# Patient Record
Sex: Female | Born: 2000
Health system: Southern US, Community
[De-identification: ages and names within clinical notes are randomized; demographics above are authoritative.]

## PROBLEM LIST (undated history)

## (undated) DIAGNOSIS — R51 Headache: Secondary | ICD-10-CM

## (undated) DIAGNOSIS — T7840XA Allergy, unspecified, initial encounter: Secondary | ICD-10-CM

## (undated) DIAGNOSIS — R519 Headache, unspecified: Secondary | ICD-10-CM

## (undated) DIAGNOSIS — J189 Pneumonia, unspecified organism: Secondary | ICD-10-CM

## (undated) DIAGNOSIS — F419 Anxiety disorder, unspecified: Secondary | ICD-10-CM

## (undated) DIAGNOSIS — F32A Depression, unspecified: Secondary | ICD-10-CM

## (undated) DIAGNOSIS — J45909 Unspecified asthma, uncomplicated: Secondary | ICD-10-CM

## (undated) DIAGNOSIS — H539 Unspecified visual disturbance: Secondary | ICD-10-CM

## (undated) HISTORY — DX: Unspecified asthma, uncomplicated: J45.909

## (undated) HISTORY — DX: Pneumonia, unspecified organism: J18.9

## (undated) HISTORY — DX: Headache: R51

## (undated) HISTORY — DX: Anxiety disorder, unspecified: F41.9

## (undated) HISTORY — DX: Allergy, unspecified, initial encounter: T78.40XA

## (undated) HISTORY — DX: Headache, unspecified: R51.9

---
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
## 2019-01-02 ENCOUNTER — Other Ambulatory Visit: Payer: Self-pay

## 2019-01-03 ENCOUNTER — Ambulatory Visit: Payer: BC Managed Care – PPO | Admitting: Family Medicine

## 2019-01-07 ENCOUNTER — Telehealth: Payer: Self-pay | Admitting: Obstetrics and Gynecology

## 2019-01-07 DIAGNOSIS — O020 Blighted ovum and nonhydatidiform mole: Secondary | ICD-10-CM

## 2019-01-07 NOTE — Telephone Encounter (Signed)
Phone call to [facility].  She has not been seen in 2 weeks.  Quantitative hCG levels are dropping nicely.  Patient advised to recheck hCG this week and then weekly until 0, with plans for 3 months of follow-up hCGs.  IUD in place.  Patient additionally has questions about some headaches and nausea that she has been experiencing and wondering if it is related to the IUD.  She also was informed that it would not be unusual for her to be amenorrheic as the IUD was placed promptly after the suction D&C

## 2019-01-13 ENCOUNTER — Other Ambulatory Visit: Payer: Self-pay

## 2019-01-14 ENCOUNTER — Encounter: Payer: Self-pay | Admitting: Family Medicine

## 2019-01-14 ENCOUNTER — Ambulatory Visit (INDEPENDENT_AMBULATORY_CARE_PROVIDER_SITE_OTHER): Payer: BC Managed Care – PPO | Admitting: Family Medicine

## 2019-01-14 VITALS — BP 120/80 | HR 84 | Temp 98.8°F | Resp 12 | Ht 64.0 in | Wt 158.0 lb

## 2019-01-14 DIAGNOSIS — F332 Major depressive disorder, recurrent severe without psychotic features: Secondary | ICD-10-CM | POA: Diagnosis not present

## 2019-01-14 DIAGNOSIS — F411 Generalized anxiety disorder: Secondary | ICD-10-CM | POA: Diagnosis not present

## 2019-01-14 DIAGNOSIS — O020 Blighted ovum and nonhydatidiform mole: Secondary | ICD-10-CM

## 2019-01-14 MED ORDER — FLUOXETINE HCL 20 MG PO CAPS: 20.0000 mg | ORAL_CAPSULE | Freq: Every day | ORAL | 3 refills | Status: DC

## 2019-01-14 NOTE — Assessment & Plan Note (Signed)
This is been a very traumatic event for her going through the molar pregnancy having to have the procedure done and then IUD placed.  I think she had very helpful feelings with regard to this pregnancy and her new relationship and this did not end out how she had planned.  She is willing to seek psychotherapy and get back on her medications.  I Minna start her on Prozac 20 mg a day.  I have given her the information and phone number for [facility] she is going to call them and set up her appointment to get back in with her psychiatrist and psychotherapist.  I will go ahead and schedule follow-up with me in a few weeks to make sure she is staying on track.  While we will go ahead and check her hCG level since we have a lab here.  I will forward this to her OB/GYN

## 2019-01-14 NOTE — Patient Instructions (Addendum)
Start prozac once a day  Call [facility]-  Address: [address] [place], [place] [address] Phone: [phone] F/U 4 weeks

## 2019-01-14 NOTE — Progress Notes (Signed)
Subjective:    Patient ID: Amy Fuller, female    DOB: [birth year], 18 y.o.   MRN: [id number]  Patient presents for Depression (stopped meds d/t pregnancy- molar pregnancy- needs new meds) Patient here to discuss medications.  At her last visit she had a positive pregnancy test.  She had bleeding during pregnancy was found to have a molar pregnancy.  She had extremely high elevated hCG levels greater than 300,000.  She underwent D&C by OB/GYN.  She is due to have repeat hCG quant levels.  She did have IUD placed 2 weeks ago with initially having some headaches nausea but that has now subsided.  This is been a very stressful event for her.  She has a lot of difficulty going through the GYN process which she explains.  She is also felt more anxious having more panic attacks.  She has not had any self-harm or cutting.  Initially hurt her boyfriend were at odds but they are now working together and he has been very supportive.  She is back in school to get her high school diploma and would like to return to psychotherapy.  Until the end of last year she was on Abilify, trazodone, Prozac and Risperdal.  She states that she has had a few episodes where she felt like she has had an out of body experience.  She sees herself walking with her boyfriend their 2 kids and what looks like a little girl but she cannot see the face.  This is happened a few times since she lost the pregnancy.  She also finds herself quite tearful if she sees another pregnant lady out in public or small baby Review Of Systems:  GEN- denies fatigue, fever, weight loss,weakness, recent illness HEENT- denies eye drainage, change in vision, nasal discharge, CVS- denies chest pain, palpitations RESP- denies SOB, cough, wheeze ABD- denies N/V, change in stools, abd pain GU- denies dysuria, hematuria, dribbling, incontinence MSK- denies joint pain, muscle aches, injury Neuro- denies headache, dizziness, syncope, seizure  activity       Objective:    BP 120/80   Pulse 84   Temp 98.8 F (37.1 C) (Oral)   Resp 12   Ht 5\' 4"  (1.626 m)   Wt 158 lb (71.7 kg)   SpO2 98%   BMI 27.12 kg/m  GEN- NAD, alert and oriented x3 HEENT- PERRL, EOMI, non injected sclera, pink conjunctiva, MMM, oropharynx clear Neck- Supple, no thyromegaly CVS- RRR, no murmur RESP-CTAB ABD-NABS,soft,NT,ND pSYCH-depressed affect, anxious appearing normal speech good eye contact no suicidal ideations       Assessment & Plan:      Problem List Items Addressed This Visit      Unprioritized   GAD (generalized anxiety disorder)   Relevant Medications   FLUoxetine (PROZAC) 20 MG capsule   MDD (major depressive disorder), recurrent episode, severe (HCC)    This is been a very traumatic event for her going through the molar pregnancy having to have the procedure done and then IUD placed.  I think she had very helpful feelings with regard to this pregnancy and her new relationship and this did not end out how she had planned.  She is willing to seek psychotherapy and get back on her medications.  I Minna start her on Prozac 20 mg a day.  I have given her the information and [facility] for [facility] she is going to call them and set up her appointment to get back in with her  psychiatrist and psychotherapist.  I will go ahead and schedule follow-up with me in a few weeks to make sure she is staying on track.  While we will go ahead and check her hCG level since we have a lab here.  I will forward this to her OB/GYN      Relevant Medications   FLUoxetine (PROZAC) 20 MG capsule    Other Visit Diagnoses    Molar pregnancy    -  Primary   Relevant Orders   hCG, quantitative, pregnancy      Note: This dictation was prepared with Dragon dictation along with smaller phrase technology. Any transcriptional errors that result from this process are unintentional.

## 2019-01-15 ENCOUNTER — Telehealth: Payer: Self-pay | Admitting: Obstetrics and Gynecology

## 2019-01-15 LAB — HCG, QUANTITATIVE, PREGNANCY: HCG, Total, QN: 673 m[IU]/mL

## 2019-01-15 NOTE — Telephone Encounter (Signed)
Pt went to her PCP yesterday and they drew her HCG levels and is requesting a nurse to call her with the results.

## 2019-01-15 NOTE — Telephone Encounter (Addendum)
Patient had hcg level drawn at her PCP office yesterday. Results are in epic. Patient requesting to know what plan is for this.   Ref Range & Units 1d ago  HCG, Total, QN mIU/mL 673

## 2019-01-17 ENCOUNTER — Other Ambulatory Visit: Payer: Self-pay | Admitting: Obstetrics and Gynecology

## 2019-01-17 DIAGNOSIS — O020 Blighted ovum and nonhydatidiform mole: Secondary | ICD-10-CM

## 2019-01-17 NOTE — Progress Notes (Signed)
hcg has levelled off at 673, noted 20 days after 691.  will discuss with Gyn Onc over how long to follow before referral.

## 2019-01-19 ENCOUNTER — Telehealth: Payer: Self-pay | Admitting: Obstetrics and Gynecology

## 2019-01-19 DIAGNOSIS — O020 Blighted ovum and nonhydatidiform mole: Secondary | ICD-10-CM

## 2019-01-19 NOTE — Telephone Encounter (Signed)
Amy Fuller is aware that HCG levels have leveled off, and did not decline as much as expected from 7/8 til 7/28, 691->673. I will discuss with Dr Denman George, and am ordering a Q hcg for this week.  Pt is aware that future therapy with methotrexate is one treatment option that may be needed. Will follow up after this Wednesday's Q-Hcg and discussion with Dr Denman George.

## 2019-01-20 ENCOUNTER — Other Ambulatory Visit: Payer: Self-pay

## 2019-01-20 ENCOUNTER — Ambulatory Visit (INDEPENDENT_AMBULATORY_CARE_PROVIDER_SITE_OTHER): Payer: BC Managed Care – PPO | Admitting: Family Medicine

## 2019-01-20 ENCOUNTER — Encounter: Payer: Self-pay | Admitting: Family Medicine

## 2019-01-20 VITALS — BP 128/60 | HR 88 | Temp 98.6°F | Resp 14 | Ht 64.0 in | Wt 158.0 lb

## 2019-01-20 DIAGNOSIS — F411 Generalized anxiety disorder: Secondary | ICD-10-CM

## 2019-01-20 DIAGNOSIS — F332 Major depressive disorder, recurrent severe without psychotic features: Secondary | ICD-10-CM | POA: Diagnosis not present

## 2019-01-20 DIAGNOSIS — L509 Urticaria, unspecified: Secondary | ICD-10-CM | POA: Diagnosis not present

## 2019-01-20 MED ORDER — CITALOPRAM HYDROBROMIDE 10 MG PO TABS: 10.0000 mg | ORAL_TABLET | Freq: Every day | ORAL | 1 refills | Status: DC

## 2019-01-20 NOTE — Assessment & Plan Note (Signed)
Will D/C prozac and newest medication prior to hives No new foods, lotions etc Will not chance trying again,especially with body already in semi- inflammed state trying to rid of molar pregnancy She has been on celexa as well in past and does not recall any problems with that Will start celexa 10mg  once a day

## 2019-01-20 NOTE — Patient Instructions (Addendum)
Call [facility]-  Address: [address] [place], [place] [address] Phone: [phone] Start celexa 10mg  once a day  Use topical cortisone on arm  F/U 4 weeks for medications

## 2019-01-20 NOTE — Progress Notes (Signed)
   Subjective:    Patient ID: Amy Fuller, female    DOB: [birth year], 18 y.o.   MRN: [id number]  Patient presents for Rash (just started Prozac last night- woke up with hives on arm this morning- has gotten better)  Pt here with rash to left arm that started this morning around 7:30am. She took her first dose of prozac last night. She has taken prozac in the past. She took medication around 11pm, but couldn't go to sleep until 4am. Only broke out on left arm, no where else, no facial swelling. Very pruritic   It went down on its own after a few hours, did not take bendaryl as this tends to make her jittery and she had school today.   No fever, no chills  Due for a repeat lab test this Wed for her molar pregnancy, levels have dropped but not as low as GYN would expect   Note she has not called [facility] yet- which again I discussed the importance of  Review Of Systems:  GEN- denies fatigue, fever, weight loss,weakness, recent illness HEENT- denies eye drainage, change in vision, nasal discharge, CVS- denies chest pain, palpitations RESP- denies SOB, cough, wheeze ABD- denies N/V, change in stools, abd pain GU- denies dysuria, hematuria, dribbling, incontinence MSK- denies joint pain, muscle aches, injury Neuro- denies headache, dizziness, syncope, seizure activity       Objective:    BP 128/60   Pulse 88   Temp 98.6 F (37 C) (Oral)   Resp 14   Ht 5\' 4"  (1.626 m)   Wt 158 lb (71.7 kg)   SpO2 99%   BMI 27.12 kg/m  GEN- NAD, alert and oriented x3 HEENT- PERRL, EOMI, non injected sclera, pink conjunctiva, MMM, oropharynx clear, uvula midline  Neck- Supple, LAD CVS- RRR, no murmur RESP-CTAB Skin- few scattered mild fine erythematous maculoapular left on inner left wrist,rest of arm clear        Assessment & Plan:      Problem List Items Addressed This Visit      Unprioritized   GAD (generalized anxiety disorder) - Primary   Relevant Medications   citalopram  (CELEXA) 10 MG tablet   MDD (major depressive disorder), recurrent episode, severe (HCC)    Will D/C prozac and newest medication prior to hives No new foods, lotions etc Will not chance trying again,especially with body already in semi- inflammed state trying to rid of molar pregnancy She has been on celexa as well in past and does not recall any problems with that Will start celexa 10mg  once a day       Relevant Medications   citalopram (CELEXA) 10 MG tablet    Other Visit Diagnoses    Hives       Minimal residual hives on left arm, can use topical cortisone for itching      Note: This dictation was prepared with Dragon dictation along with smaller phrase technology. Any transcriptional errors that result from this process are unintentional.

## 2019-01-21 ENCOUNTER — Telehealth: Payer: Self-pay

## 2019-01-21 ENCOUNTER — Telehealth: Payer: Self-pay | Admitting: Obstetrics and Gynecology

## 2019-01-21 NOTE — Telephone Encounter (Signed)
Gave message to receptionist stating that Dr. Denman George recommends 3 HCG levels from 3 weeks in a row to be sent to her to review to determine if a  referral to GYN Oncology for molar pregnancy needed.

## 2019-01-21 NOTE — Telephone Encounter (Signed)
Louise with Dr. Serita Grit office states Dr. Denman George advises to have pt to have her  HCG levels drawn for 3 weeks in a row and then send to Dr. Denman George for review. [phone]

## 2019-01-22 ENCOUNTER — Other Ambulatory Visit: Payer: BC Managed Care – PPO

## 2019-01-22 ENCOUNTER — Other Ambulatory Visit: Payer: Self-pay

## 2019-01-22 DIAGNOSIS — O020 Blighted ovum and nonhydatidiform mole: Secondary | ICD-10-CM

## 2019-01-23 ENCOUNTER — Telehealth: Payer: Self-pay | Admitting: *Deleted

## 2019-01-23 ENCOUNTER — Telehealth: Payer: Self-pay | Admitting: Obstetrics and Gynecology

## 2019-01-23 LAB — BETA HCG QUANT (REF LAB): hCG Quant: 459 m[IU]/mL

## 2019-01-23 NOTE — Telephone Encounter (Signed)
Patient informed of HCG level.  Verbalized understanding.

## 2019-01-23 NOTE — Telephone Encounter (Signed)
Patient called stating that she would like a call back from the nurse, pt states that she had blood work and would like to know the results.

## 2019-01-27 ENCOUNTER — Other Ambulatory Visit: Payer: Self-pay | Admitting: Obstetrics and Gynecology

## 2019-01-27 DIAGNOSIS — O020 Blighted ovum and nonhydatidiform mole: Secondary | ICD-10-CM

## 2019-01-27 NOTE — Progress Notes (Signed)
Followup HCG at 56 days after d&c ordered.

## 2019-01-29 ENCOUNTER — Encounter: Payer: Self-pay | Admitting: Obstetrics and Gynecology

## 2019-01-29 ENCOUNTER — Other Ambulatory Visit: Payer: Self-pay

## 2019-01-29 ENCOUNTER — Ambulatory Visit (INDEPENDENT_AMBULATORY_CARE_PROVIDER_SITE_OTHER): Payer: BC Managed Care – PPO | Admitting: Obstetrics and Gynecology

## 2019-01-29 VITALS — BP 133/82 | HR 95 | Ht 64.0 in | Wt 154.2 lb

## 2019-01-29 DIAGNOSIS — O020 Blighted ovum and nonhydatidiform mole: Secondary | ICD-10-CM

## 2019-01-29 NOTE — Progress Notes (Addendum)
Patient ID: Amy Fuller, female   DOB: 09/04/2000, 18 y.o.   MRN: [id number]  Subjective:  Amy Fuller is a 18 y.o. female now 7 weeks status post suction dilation and curettage for COMPLETE MOLAR PREGNANCY,    some cramping with old brownish discharge,  Review of Systems Negative except some cramping   Diet:   normal   Bowel movements : normal.  Pain is controlled with current analgesics. Medications being used: ibuprofen (OTC).  Objective:  There were no vitals taken for this visit. General:Well developed, well nourished.  No acute distress. Abdomen: Bowel sounds normal, soft, non-tender. Pelvic Exam: Deferred, discussion only  Incision(s): N/a  Assessment:  Post-Op 7 weeks s/p suction dilation and curettage   for COMPLETE MOLAR PREGNANCY, WITH persistent low hcg levels.    Plan:  1.HCG level discussed  300k+- 600's -- now 491.(week 6) 2. current medications.ibuprofen 3. Activity restrictions: none 4. return to work: not applicable. 5. Follow up in 1 week w/ HCG blood work if remains elevated at 8wk will refer for  methotrexate 7. Televisit 2 weeks to decide on referral.  By signing my name below, I, Samul Dada, attest that this documentation has been prepared under the direction and in the presence of Jonnie Kind, MD. Electronically Signed: [employer]. 01/29/19. 10:05 AM.  I personally performed the services described in this documentation, which was SCRIBED in my presence. The recorded information has been reviewed and considered accurate. It has been edited as necessary during review. Jonnie Kind, MD

## 2019-01-31 ENCOUNTER — Other Ambulatory Visit: Payer: BC Managed Care – PPO

## 2019-02-03 ENCOUNTER — Other Ambulatory Visit: Payer: Self-pay | Admitting: Obstetrics and Gynecology

## 2019-02-03 DIAGNOSIS — O020 Blighted ovum and nonhydatidiform mole: Secondary | ICD-10-CM

## 2019-02-03 NOTE — Progress Notes (Signed)
HCG lab order for early this week placed, to move up from Friday.

## 2019-02-04 ENCOUNTER — Other Ambulatory Visit: Payer: Self-pay

## 2019-02-04 ENCOUNTER — Encounter (HOSPITAL_COMMUNITY): Payer: Self-pay | Admitting: Emergency Medicine

## 2019-02-04 ENCOUNTER — Telehealth: Payer: Self-pay | Admitting: Obstetrics and Gynecology

## 2019-02-04 ENCOUNTER — Emergency Department (HOSPITAL_COMMUNITY)
Admission: EM | Admit: 2019-02-04 | Discharge: 2019-02-04 | Disposition: A | Discharge status: Home / Self Care | Payer: BC Managed Care – PPO | Attending: Emergency Medicine | Admitting: Emergency Medicine

## 2019-02-04 DIAGNOSIS — Z7722 Contact with and (suspected) exposure to environmental tobacco smoke (acute) (chronic): Secondary | ICD-10-CM | POA: Diagnosis not present

## 2019-02-04 DIAGNOSIS — R102 Pelvic and perineal pain: Secondary | ICD-10-CM | POA: Diagnosis not present

## 2019-02-04 LAB — URINALYSIS, ROUTINE W REFLEX MICROSCOPIC
Bilirubin Urine: NEGATIVE
Glucose, UA: NEGATIVE mg/dL
Ketones, ur: NEGATIVE mg/dL
Nitrite: NEGATIVE
Protein, ur: 100 mg/dL — AB
Specific Gravity, Urine: 1.029 (ref 1.005–1.030)
pH: 5 (ref 5.0–8.0)

## 2019-02-04 LAB — COMPREHENSIVE METABOLIC PANEL
ALT: 13 U/L (ref 0–44)
AST: 16 U/L (ref 15–41)
Albumin: 3.9 g/dL (ref 3.5–5.0)
Alkaline Phosphatase: 94 U/L (ref 38–126)
Anion gap: 5 (ref 5–15)
BUN: 10 mg/dL (ref 6–20)
CO2: 25 mmol/L (ref 22–32)
Calcium: 8.9 mg/dL (ref 8.9–10.3)
Chloride: 108 mmol/L (ref 98–111)
Creatinine, Ser: 0.61 mg/dL (ref 0.44–1.00)
GFR calc Af Amer: >60 mL/min (ref >60)
GFR calc non Af Amer: >60 mL/min (ref >60)
Glucose, Bld: 91 mg/dL (ref 70–99)
Potassium: 3.6 mmol/L (ref 3.5–5.1)
Sodium: 138 mmol/L (ref 135–145)
Total Bilirubin: 0.3 mg/dL (ref 0.3–1.2)
Total Protein: 6.8 g/dL (ref 6.5–8.1)

## 2019-02-04 LAB — I-STAT BETA HCG BLOOD, ED (MC, WL, AP ONLY): I-stat hCG, quantitative: 141.2 m[IU]/mL — ABNORMAL HIGH (ref <5)

## 2019-02-04 LAB — CBC WITH DIFFERENTIAL/PLATELET
Abs Immature Granulocytes: 0.03 10*3/uL (ref 0.00–0.07)
Basophils Absolute: 0.1 10*3/uL (ref 0.0–0.1)
Basophils Relative: 1 %
Eosinophils Absolute: 0.3 10*3/uL (ref 0.0–0.5)
Eosinophils Relative: 3 %
HCT: 43.3 % (ref 36.0–46.0)
Hemoglobin: 13.9 g/dL (ref 12.0–15.0)
Immature Granulocytes: 0 %
Lymphocytes Relative: 26 %
Lymphs Abs: 2.6 10*3/uL (ref 0.7–4.0)
MCH: 30 pg (ref 26.0–34.0)
MCHC: 32.1 g/dL (ref 30.0–36.0)
MCV: 93.3 fL (ref 80.0–100.0)
Monocytes Absolute: 0.9 10*3/uL (ref 0.1–1.0)
Monocytes Relative: 9 %
Neutro Abs: 6.2 10*3/uL (ref 1.7–7.7)
Neutrophils Relative %: 61 %
Platelets: 309 10*3/uL (ref 150–400)
RBC: 4.64 MIL/uL (ref 3.87–5.11)
RDW: 11.9 % (ref 11.5–15.5)
WBC: 10.1 10*3/uL (ref 4.0–10.5)
nRBC: 0 % (ref 0.0–0.2)

## 2019-02-04 LAB — WET PREP, GENITAL
Clue Cells Wet Prep HPF POC: NONE SEEN
Sperm: NONE SEEN
Trich, Wet Prep: NONE SEEN
Yeast Wet Prep HPF POC: NONE SEEN

## 2019-02-04 NOTE — Telephone Encounter (Signed)
Patient called, stated that she and Dr. Glo Herring have been messaging through mychart.  She is in so much pain and would like to know what to do.  [phone]

## 2019-02-04 NOTE — Discharge Instructions (Addendum)
Return for ultrasound.  See Dr. Glo Herring for recheck.   Hcg is 141

## 2019-02-04 NOTE — Telephone Encounter (Signed)
Have her come in tomorrow as work in appt

## 2019-02-04 NOTE — ED Triage Notes (Signed)
Pt states she had cramping since her D&C in June for a molar pregnancy. States she has contacted [facility] about it since it has happened. When she called the on call nurse there they told her to come here. States she doesn't have any vaginal bleeding

## 2019-02-05 ENCOUNTER — Encounter: Payer: Self-pay | Admitting: Obstetrics and Gynecology

## 2019-02-05 ENCOUNTER — Ambulatory Visit (INDEPENDENT_AMBULATORY_CARE_PROVIDER_SITE_OTHER): Payer: BC Managed Care – PPO | Admitting: Obstetrics and Gynecology

## 2019-02-05 ENCOUNTER — Ambulatory Visit (HOSPITAL_COMMUNITY)
Admission: RE | Admit: 2019-02-05 | Discharge: 2019-02-05 | Disposition: A | Discharge status: Home / Self Care | Payer: BC Managed Care – PPO | Source: Ambulatory Visit | Attending: Obstetrics and Gynecology | Admitting: Obstetrics and Gynecology

## 2019-02-05 ENCOUNTER — Ambulatory Visit (HOSPITAL_COMMUNITY): Payer: BC Managed Care – PPO

## 2019-02-05 VITALS — BP 109/74 | HR 93 | Ht 63.0 in | Wt 156.3 lb

## 2019-02-05 DIAGNOSIS — O020 Blighted ovum and nonhydatidiform mole: Secondary | ICD-10-CM

## 2019-02-05 DIAGNOSIS — R102 Pelvic and perineal pain: Secondary | ICD-10-CM | POA: Diagnosis not present

## 2019-02-05 NOTE — ED Provider Notes (Signed)
[facility] EMERGENCY DEPARTMENT Provider Note   CSN: [id number] Arrival date & time: 02/04/19  1943     History   Chief Complaint Chief Complaint  Patient presents with  . Cramping (D&C hx)    HPI Amy Fuller is a 18 y.o. female.     Pt had a D and e on 6/26 due to a molar pregnancy.  Pt reports she continues to have lower abdominal cramping.  Pt denies any current bleeding.  No fever, no chills.  Pt denies std risk.  Pt has an IUD to prevent pregnancy.  Pt has been followed by Dr. Onnie Boer and HCg has been decreasing.   The history is provided by the patient. No language interpreter was used.    Past Medical History:  Diagnosis Date  . Allergy   . Asthma   . Frequent headaches   . Pneumonia 2004  . Vision abnormalities     Patient Active Problem List   Diagnosis Date Noted  . GAD (generalized anxiety disorder) 12/18/2017  . Deliberate self-cutting 12/18/2017  . Insomnia 12/15/2015  . MDD (major depressive disorder), recurrent episode, severe ([place]) 12/13/2015  . Hearing problem of both ears 09/03/2013  . Exercise-induced asthma 09/03/2013  . Hyperacusis of both ears 09/03/2013    Past Surgical History:  Procedure Laterality Date  . DILATION AND CURETTAGE OF UTERUS N/A 12/13/2018   Procedure: SUCTION DILATATION AND CURETTAGE;  Surgeon: Jonnie Kind, MD;  Location: [facility];  Service: Gynecology;  Laterality: N/A;     OB History    Gravida  1   Para      Term      Preterm      AB  1   Living        SAB      TAB      Ectopic      Multiple      Live Births               Home Medications    Prior to Admission medications   Medication Sig Start Date End Date Taking? Authorizing Provider  acetaminophen (TYLENOL) 500 MG tablet Take 500 mg by mouth every 6 (six) hours as needed for mild pain or moderate pain.   Yes [provider]  citalopram (CELEXA) 10 MG tablet Take 1 tablet (10 mg total) by mouth daily. Patient not  taking: Reported on 01/29/2019 01/20/19   Alycia Rossetti, MD    Family History Family History  Problem Relation Age of Onset  . Miscarriages / [place] Mother   . Arthritis Maternal Grandmother   . Heart disease Maternal Grandfather   . Hypertension Maternal Grandfather   . Cancer Paternal Grandmother   . Cancer Paternal Grandfather     Social History Social History   Tobacco Use  . Smoking status: Passive Smoke Exposure - Never Smoker  . Smokeless tobacco: Never Used  Substance Use Topics  . Alcohol use: No  . Drug use: Not Currently    Types: Marijuana     Allergies   Patient has no known allergies.   Review of Systems Review of Systems  Genitourinary: Negative for dysuria and vaginal discharge.  All other systems reviewed and are negative.    Physical Exam Updated Vital Signs BP 121/70 (BP Location: Right Arm)   Pulse 94   Temp 98.4 F (36.9 C) (Oral)   Resp 16   Ht 5\' 4"  (1.626 m)   Wt 74.8 kg  SpO2 99%   BMI 28.32 kg/m   Physical Exam Vitals signs and nursing note reviewed.  Constitutional:      Appearance: She is well-developed.  HENT:     Head: Normocephalic.     Right Ear: Tympanic membrane normal.     Left Ear: Tympanic membrane normal.     Nose: Nose normal.     Mouth/Throat:     Mouth: Mucous membranes are moist.  Neck:     Musculoskeletal: Normal range of motion.  Cardiovascular:     Rate and Rhythm: Normal rate and regular rhythm.     Pulses: Normal pulses.     Heart sounds: Normal heart sounds.  Pulmonary:     Effort: Pulmonary effort is normal.  Abdominal:     General: Abdomen is flat. There is no distension.     Palpations: Abdomen is soft.  Genitourinary:    General: Normal vulva.     Vagina: No vaginal discharge.     Comments: Tender uterus and cervix,  No discharge,  IUD strings visualized,  Musculoskeletal: Normal range of motion.  Skin:    General: Skin is warm.  Neurological:     General: No focal deficit  present.     Mental Status: She is alert and oriented to person, place, and time.  Psychiatric:        Mood and Affect: Mood normal.      ED Treatments / Results  Labs (all labs ordered are listed, but only abnormal results are displayed) Labs Reviewed  WET PREP, GENITAL - Abnormal; Notable for the following components:      Result Value   WBC, Wet Prep HPF POC MODERATE (*)    All other components within normal limits  URINALYSIS, ROUTINE W REFLEX MICROSCOPIC - Abnormal; Notable for the following components:   APPearance HAZY (*)    Hgb urine dipstick MODERATE (*)    Protein, ur 100 (*)    Leukocytes,Ua TRACE (*)    Bacteria, UA RARE (*)    All other components within normal limits  I-STAT BETA HCG BLOOD, ED ([facility], [facility], [facility] ONLY) - Abnormal; Notable for the following components:   I-stat hCG, quantitative 141.2 (*)    All other components within normal limits  CBC WITH DIFFERENTIAL/PLATELET  COMPREHENSIVE METABOLIC PANEL  GC/CHLAMYDIA PROBE AMP ([place]) NOT [facility]    EKG None  Radiology [place] Transvaginal Non-ob  Result Date: 02/05/2019 CLINICAL DATA:  Follow-up molar pregnancy.  Pelvic pain EXAM: ULTRASOUND PELVIS TRANSVAGINAL TECHNIQUE: Transvaginal ultrasound examination of the pelvis was performed including evaluation of the uterus, ovaries, adnexal regions, and pelvic cul-de-sac. COMPARISON:  12/09/2018 outside study FINDINGS: Uterus Measurements: 6.4 x 3.3 x 3.7 cm = volume: 40.8 mL. No fibroids or other mass visualized. Endometrium Thickness: 2 mm in thickness. IUD noted in place. Previously seen complex cystic areas and thickening of the endometrium no longer visualized. Right ovary Measurements: 2.4 x 2.5 x 1.3 cm = volume: 3.9 mL. Normal appearance/no adnexal mass. Left ovary Measurements: 2.3 x 2.8 x 1.7 cm = volume: 5.6 mL. Normal appearance/no adnexal mass. Other findings:  Trace free fluid in the pelvis. IMPRESSION: Endometrium has a normal appearance and thickness.  IUD noted in expected position. No acute findings. Electronically Signed   By: Charlett NoseKevin  Dover M.D.   On: 02/05/2019 15:20    Procedures Procedures (including critical care time)  Medications Ordered in ED Medications - No data to display   Initial Impression / Assessment and Plan /  ED Course  I have reviewed the triage vital signs and the nursing notes.  Pertinent labs & imaging results that were available during my care of the patient were reviewed by me and considered in my medical decision making (see chart for details).        MDM   HCg is declining.  Wet prep no acute findings.  GC and Ct ordered.  I will schedule pt for ultrasound.  Pt advised to call Dr. Emelda FearFerguson to schedule followup   Final Clinical Impressions(s) / ED Diagnoses   Final diagnoses:  Pelvic pain    ED Discharge Orders         Ordered    US PELVIC COMPLETE W TRANSVAGINAL AND TORSION R/O     02/04/19 2245        An After Visit Summary was printed and given to the patient.    Elson AreasSofia, Emmelina Mcloughlin K, [place]PA-C 02/05/19 1901    Mancel BaleWentz, [place], MD 02/07/19 1037

## 2019-02-05 NOTE — Telephone Encounter (Signed)
Called patient, lmoam to come at 10:30am today.  If this doesn't work for her to call us to reschedule

## 2019-02-05 NOTE — Progress Notes (Addendum)
Patient ID: Amy Fuller, female   DOB: [birth year], 18 y.o.   MRN: [id number]    [facility]  @DATE @            Patient name: Amy Fuller MRN [id number]  Date of birth: [birth year]  CC & HPI:  Amy Fuller is a 18 y.o. female presenting today for ED followup and discussion of HCG lab results. Quant count at 141.2 from 02/04/2019 lab. Had cramping and called twice yesterday and "never got a call back".  Pt called after hours message center, and On call nurse service told her to go to ER.  ED records not available for review . Pt reports that she "Was supposed to have u/s ordered in f/u of ED visit but that is not yet completed. ROS: Had severe cramping 02/03/2019 had to leave work due to vomiting. Is not in unbearable pain at current time of appointment. Ibuprofen and Tylenol and toradol didn't work. Doesn't want pain med to take consistently, only for severe pain. Has tried hot baths, compress and "nothing helps." She has taken ibuprofen, tylenol, toradol ( once) Patient remains somewhat angry over most aspects of the care for the molar pregnancy..  She mentions again that when she first called 1 afternoon for her concerns early in the pregnancy, that she did not receive a phone call back that afternoon. I have let her know that we have reviewed processes to be sure that calls are routed to RN/LPN/Provider ,  ROS:  ROS +abdominal cramping +anxiety, impatient  -vaginal bleeding -fever  Pertinent History Reviewed:   Reviewed:  Medical         Past Medical History:  Diagnosis Date  . Allergy   . Asthma   . Frequent headaches   . Pneumonia 2004  . Vision abnormalities                               Surgical Hx:    Past Surgical History:  Procedure Laterality Date  . DILATION AND CURETTAGE OF UTERUS N/A 12/13/2018   Procedure: SUCTION DILATATION AND CURETTAGE;  Surgeon: Tilda BurrowFerguson, Jayel Scaduto V, MD;  Location: [facility];  Service: Gynecology;  Laterality: N/A;   Medications:  Reviewed & Updated - see associated section                       Current Outpatient Medications:  .  acetaminophen (TYLENOL) 500 MG tablet, Take 500 mg by mouth every 6 (six) hours as needed for mild pain or moderate pain., Disp: , Rfl:  .  citalopram (CELEXA) 10 MG tablet, Take 1 tablet (10 mg total) by mouth daily. (Patient not taking: Reported on 01/29/2019), Disp: 30 tablet, Rfl: 1   Social History: Reviewed -  reports that she is a non-smoker but has been exposed to tobacco smoke. She has never used smokeless tobacco.  Objective Findings:  Vitals: Blood pressure 109/74, pulse 93, height 5\' 3"  (1.6 m), weight 156 lb 4.8 oz (70.9 kg), unknown if currently breastfeeding.  PHYSICAL EXAMINATION General appearance - alert, well appearing, and in no distress and anxious Mental status - alert, oriented to person, place, and time, normal behavior, speech, dress, motor activity, and thought processes, anxious  PELVIC Deferred, pt declines exam, was examined yesterday so sees no reason to repeat, which I agree with.  Assessment & Plan:   A:  1.  Molar pregnancy, slowly declining  HCG levels. 2. IUD in place 3. Pelvic discomforts tolerated poorly by pt.  P:  1. Tv u/s to check IUD placement 2.  will discuss with gyn oncologist will discuss course of care once u/s back to consider Possible use of methotrexate  Addendum:  Pt called office Thursday for results of u/s and to let us know she was not given Rx for analgesic.  Pt notified that u/s shows IUD in proper location and no significant tissue remains in endometrial cavity. Pt reports that Vicodin alternating with Ibuprofen solved her pain initially postop, and that she DOES need something in addition to ibuprofen. Vicodin 5 x 30 tabs e-scribed. Pt also informed that I have reviewed her case with Dr Denman George, who states that as long as HCG drops by 10% or more over a 14 day period,(3 weekly labs.), then waiting and watching is appropriate. If  HCG does NOT drop 10% then Dr Denman George follows recent data to suggest repeating D&C before using MTX, as 2/3 of cases will respond to repeat D&C and not need MTX. Jonnie Kind, MD 4:54 pm 8/20  By signing my name below, I, Samul Dada, attest that this documentation has been prepared under the direction and in the presence of Jonnie Kind, MD. Electronically Signed: [place]. 02/05/19. 11:08 AM.  I personally performed the services described in this documentation, which was SCRIBED in my presence. The recorded information has been reviewed and considered accurate. It has been edited as necessary during review. Jonnie Kind, MD

## 2019-02-06 ENCOUNTER — Telehealth: Payer: Self-pay | Admitting: *Deleted

## 2019-02-06 ENCOUNTER — Telehealth: Payer: Self-pay | Admitting: Obstetrics and Gynecology

## 2019-02-06 LAB — GC/CHLAMYDIA PROBE AMP (~~LOC~~) NOT AT ARMC
Chlamydia: NEGATIVE
Neisseria Gonorrhea: NEGATIVE

## 2019-02-06 MED ORDER — HYDROCODONE-ACETAMINOPHEN 5-325 MG PO TABS: 1.0000 | ORAL_TABLET | Freq: Four times a day (QID) | ORAL | 0 refills | Status: DC | PRN

## 2019-02-06 NOTE — Telephone Encounter (Signed)
Dr. Ferguson called pt and ordered Vicodin. JSY 

## 2019-02-06 NOTE — Telephone Encounter (Signed)
Dr. Glo Herring called pt and ordered Vicodin. [place]

## 2019-02-06 NOTE — Addendum Note (Signed)
Addended by: Jonnie Kind on: 02/06/2019 05:06 PM   Modules accepted: Orders

## 2019-02-06 NOTE — Telephone Encounter (Signed)
Call returned, and followup documented in addendum to office note from yesterday.

## 2019-02-06 NOTE — Telephone Encounter (Signed)
Patient called, stated she was seen yesterday and she stated Dr. Glo Herring asked her is she wanted pain meds and she said yes and he didn't give her anything.  She stated this is getting annoying.  She also stated Dr. Glo Herring told her he would call her w/her ultrasound results this morning.  [phone]  [facility]

## 2019-02-07 ENCOUNTER — Other Ambulatory Visit: Payer: Self-pay

## 2019-02-07 ENCOUNTER — Other Ambulatory Visit: Payer: BC Managed Care – PPO

## 2019-02-07 DIAGNOSIS — O020 Blighted ovum and nonhydatidiform mole: Secondary | ICD-10-CM | POA: Diagnosis not present

## 2019-02-08 LAB — BETA HCG QUANT (REF LAB): hCG Quant: 140 m[IU]/mL

## 2019-02-12 ENCOUNTER — Telehealth: Payer: Self-pay | Admitting: Obstetrics and Gynecology

## 2019-02-12 ENCOUNTER — Encounter: Payer: Self-pay | Admitting: Obstetrics and Gynecology

## 2019-02-12 ENCOUNTER — Telehealth (INDEPENDENT_AMBULATORY_CARE_PROVIDER_SITE_OTHER): Payer: BC Managed Care – PPO | Admitting: Obstetrics and Gynecology

## 2019-02-12 VITALS — Ht 65.0 in

## 2019-02-12 DIAGNOSIS — O020 Blighted ovum and nonhydatidiform mole: Secondary | ICD-10-CM

## 2019-02-12 NOTE — Telephone Encounter (Signed)
Amy Fuller was contacted by phone after the WebEx visit failed She had already completed the intake history with the nurse. Review of [name] records shows that the last hCG was 140, done last Friday, August 21 As per my discussions with Dr. Denman George, the plan is for continuing checking the serum hCGs on a regular basis and if there is less than a 10% drop in 2 weeks the patient will be referred to Dr. Denman George for consideration of repeat D&C. [name] agrees to checking the blood test every 2 weeks.  Discussed the option of weekly hCGs to or closely monitor trending and she prefers the every 2 week checks, after discussion.  We will follow-up with hCG check on September 4 and September 18 with telephone follow-up

## 2019-02-12 NOTE — Progress Notes (Signed)
webex visit unable to be completed due to technical problems.  Able to call pt and set up a plan for HCG levels q 2 wk. Will refer to gyn Onc if HCG levels fail to drop by 10% over any 2 wk period. As per discussion of case with Dr Denman George.

## 2019-02-26 ENCOUNTER — Telehealth: Payer: Self-pay | Admitting: *Deleted

## 2019-02-26 NOTE — Telephone Encounter (Signed)
Patient left message asking when she needs labs again.

## 2019-02-26 NOTE — Telephone Encounter (Signed)
Pt had a quant checked on 02/07/19. Pt needs to have it repeated. Pt wants to go on Friday, 02/28/19. Pt advised to go between 8-12. Pt voiced understanding. Pt placed on lab schedule. [place]

## 2019-02-28 ENCOUNTER — Other Ambulatory Visit: Payer: Self-pay

## 2019-02-28 ENCOUNTER — Other Ambulatory Visit: Payer: BC Managed Care – PPO

## 2019-02-28 DIAGNOSIS — O020 Blighted ovum and nonhydatidiform mole: Secondary | ICD-10-CM | POA: Diagnosis not present

## 2019-03-01 LAB — BETA HCG QUANT (REF LAB): hCG Quant: 116 m[IU]/mL

## 2019-03-02 NOTE — Telephone Encounter (Signed)
Unable to leave message, as pt has mailbox that is full. Pt will need q 2 wk hcg's to continue.

## 2019-03-11 ENCOUNTER — Other Ambulatory Visit: Payer: Self-pay | Admitting: *Deleted

## 2019-03-11 DIAGNOSIS — Z20822 Contact with and (suspected) exposure to covid-19: Secondary | ICD-10-CM

## 2019-03-11 DIAGNOSIS — R6889 Other general symptoms and signs: Secondary | ICD-10-CM | POA: Diagnosis not present

## 2019-03-12 LAB — NOVEL CORONAVIRUS, NAA: SARS-CoV-2, NAA: NOT DETECTED

## 2019-03-14 ENCOUNTER — Ambulatory Visit: Payer: BC Managed Care – PPO | Admitting: Obstetrics and Gynecology

## 2019-03-14 ENCOUNTER — Telehealth: Payer: Self-pay | Admitting: *Deleted

## 2019-03-14 DIAGNOSIS — O020 Blighted ovum and nonhydatidiform mole: Secondary | ICD-10-CM

## 2019-03-14 NOTE — Telephone Encounter (Signed)
Patient did not make an appointment for today or keep it.  Telephone call to patient.she needs a labs every 2 weeks her last lab was on the 11th I have advised her to come to the lab today.  Order has been put in

## 2019-03-14 NOTE — Telephone Encounter (Signed)
Pt wanting to know when she needs labs again.

## 2019-03-27 LAB — BETA HCG QUANT (REF LAB): hCG Quant: 19 m[IU]/mL

## 2019-04-14 ENCOUNTER — Telehealth: Payer: Self-pay | Admitting: Obstetrics and Gynecology

## 2019-04-14 NOTE — Telephone Encounter (Signed)
Telephone call to Amy Fuller as its been 3 weeks since her last hCG check.  She states that she will get her hCG rechecked on Wednesday of this week, 04/16/2019

## 2019-04-24 ENCOUNTER — Ambulatory Visit
Admission: EM | Admit: 2019-04-24 | Discharge: 2019-04-24 | Disposition: A | Discharge status: Home / Self Care | Payer: BC Managed Care – PPO | Attending: Emergency Medicine | Admitting: Emergency Medicine

## 2019-04-24 ENCOUNTER — Other Ambulatory Visit: Payer: Self-pay

## 2019-04-24 DIAGNOSIS — Z20828 Contact with and (suspected) exposure to other viral communicable diseases: Secondary | ICD-10-CM | POA: Diagnosis not present

## 2019-04-24 DIAGNOSIS — B9789 Other viral agents as the cause of diseases classified elsewhere: Secondary | ICD-10-CM

## 2019-04-24 DIAGNOSIS — J069 Acute upper respiratory infection, unspecified: Secondary | ICD-10-CM | POA: Diagnosis not present

## 2019-04-24 DIAGNOSIS — R05 Cough: Secondary | ICD-10-CM | POA: Diagnosis not present

## 2019-04-24 DIAGNOSIS — Z20822 Contact with and (suspected) exposure to covid-19: Secondary | ICD-10-CM

## 2019-04-24 MED ORDER — BENZONATATE 100 MG PO CAPS: 100.0000 mg | ORAL_CAPSULE | Freq: Three times a day (TID) | ORAL | 0 refills | Status: DC

## 2019-04-24 MED ORDER — CETIRIZINE-PSEUDOEPHEDRINE ER 5-120 MG PO TB12: 1.0000 | ORAL_TABLET | Freq: Every day | ORAL | 0 refills | Status: DC

## 2019-04-24 MED ORDER — FLUTICASONE PROPIONATE 50 MCG/ACT NA SUSP: 2.0000 | Freq: Every day | NASAL | 0 refills | Status: DC

## 2019-04-24 NOTE — ED Provider Notes (Signed)
[place]   [id number] 04/24/19 Arrival Time: 14   CC: URI symptoms; COVID testing  SUBJECTIVE: History from: patient.  Amy Fuller is a 18 y.o. female who presents with runny nose, and congestion x 1 day. Denies sick exposure to COVID, flu or strep.  Denies recent travel. Denies aggravating or alleviating factors. Reports previous symptoms in the past with cold.   Complains of cough with clearing throat.  Denies fever, chills, fatigue, sore throat, SOB, wheezing, chest pain, nausea, vomiting, changes in bowel or bladder habits.    ROS: As per HPI.  All other pertinent ROS negative.     Past Medical History:  Diagnosis Date  . Allergy   . Asthma   . Frequent headaches   . Pneumonia 2004  . Vision abnormalities    Past Surgical History:  Procedure Laterality Date  . DILATION AND CURETTAGE OF UTERUS N/A 12/13/2018   Procedure: SUCTION DILATATION AND CURETTAGE;  Surgeon: Jonnie Kind, MD;  Location: [facility];  Service: Gynecology;  Laterality: N/A;   No Known Allergies No current facility-administered medications on file prior to encounter.    Current Outpatient Medications on File Prior to Encounter  Medication Sig Dispense Refill  . acetaminophen (TYLENOL) 500 MG tablet Take 500 mg by mouth every 6 (six) hours as needed for mild pain or moderate pain.    . [DISCONTINUED] citalopram (CELEXA) 10 MG tablet Take 1 tablet (10 mg total) by mouth daily. (Patient not taking: Reported on 01/29/2019) 30 tablet 1   Social History   Socioeconomic History  . Marital status: Single    Spouse name: Not on file  . Number of children: 0  . Years of education: Not on file  . Highest education level: Not on file  Occupational History  . Not on file  Social Needs  . Financial resource strain: Not on file  . Food insecurity    Worry: Not on file    Inability: Not on file  . Transportation needs    Medical: Not on file    Non-medical: Not on file  Tobacco Use  .  Smoking status: Passive Smoke Exposure - Never Smoker  . Smokeless tobacco: Never Used  Substance and Sexual Activity  . Alcohol use: No  . Drug use: Not Currently    Types: Marijuana  . Sexual activity: Yes    Birth control/protection: I.U.D.  Lifestyle  . Physical activity    Days per week: Not on file    Minutes per session: Not on file  . Stress: Not on file  Relationships  . Social Herbalist on phone: Not on file    Gets together: Not on file    Attends religious service: Not on file    Active member of club or organization: Not on file    Attends meetings of clubs or organizations: Not on file    Relationship status: Not on file  . Intimate partner violence    Fear of current or ex partner: Not on file    Emotionally abused: Not on file    Physically abused: Not on file    Forced sexual activity: Not on file  Other Topics Concern  . Not on file  Social History Narrative  . Not on file   Family History  Problem Relation Age of Onset  . Miscarriages / [place] Mother   . Arthritis Maternal Grandmother   . Heart disease Maternal Grandfather   . Hypertension Maternal Grandfather   .  Cancer Paternal Grandmother   . Cancer Paternal Grandfather     OBJECTIVE:  Vitals:   04/24/19 1740  BP: 123/82  Pulse: (!) 110  Resp: 18  Temp: 98.8 F (37.1 C)  SpO2: 96%     General appearance: alert; appears mildly fatigued, but nontoxic; speaking in full sentences and tolerating own secretions HEENT: NCAT; Ears: EACs clear, TMs pearly gray; Eyes: PERRL.  EOM grossly intact. Nose: nares patent without rhinorrhea, Throat: oropharynx clear, tonsils non erythematous, mildly enlarged, no white exudates present, uvula midline  Neck: supple without LAD Lungs: unlabored respirations, symmetrical air entry; cough: absent; no respiratory distress; CTAB Heart: regular rate and rhythm.  Radial pulses 2+ symmetrical bilaterally Skin: warm and dry Psychological: alert and  cooperative; normal mood and affect  ASSESSMENT & PLAN:  1. Suspected COVID-19 virus infection   2. Viral URI with cough     Meds ordered this encounter  Medications  . cetirizine-pseudoephedrine (ZYRTEC-D) 5-120 MG tablet    Sig: Take 1 tablet by mouth daily.    Dispense:  30 tablet    Refill:  0    Order Specific Question:   Supervising Provider    Answer:   Eustace Moore [[id number]]  . fluticasone (FLONASE) 50 MCG/ACT nasal spray    Sig: Place 2 sprays into both nostrils daily.    Dispense:  16 g    Refill:  0    Order Specific Question:   Supervising Provider    Answer:   Eustace Moore [[id number]]  . benzonatate (TESSALON) 100 MG capsule    Sig: Take 1 capsule (100 mg total) by mouth every 8 (eight) hours.    Dispense:  21 capsule    Refill:  0    Order Specific Question:   Supervising Provider    Answer:   Eustace Moore [[id number]]    COVID testing ordered.  It will take between 5-7 days for test results.  Someone will contact you regarding abnormal results.    In the meantime: You should remain isolated in your home for 10 days from symptom onset AND greater than 72 hours after symptoms resolution (absence of fever without the use of fever-reducing medication and improvement in respiratory symptoms), whichever is longer Get plenty of rest and push fluids Tessalon Perles prescribed for cough Zyrtec-D prescribed for nasal congestion, runny nose, and/or sore throat Flonase prescribed for nasal congestion and runny nose Use medications daily for symptom relief Use OTC medications like ibuprofen or tylenol as needed fever or pain Call or go to the ED if you have any new or worsening symptoms such as fever, worsening cough, shortness of breath, chest tightness, chest pain, turning blue, changes in mental status, etc...  Reviewed expectations re: course of current medical issues. Questions answered. Outlined signs and symptoms indicating need for more acute  intervention. Patient verbalized understanding. After Visit Summary given.         Rennis Harding, PA-C 04/24/19 1756

## 2019-04-24 NOTE — ED Triage Notes (Signed)
Pt began having cold symptoms yesterday , pt has runny nose and nasal congestion

## 2019-04-24 NOTE — Discharge Instructions (Signed)
COVID testing ordered.  It will take between 5-7 days for test results.  Someone will contact you regarding abnormal results.    In the meantime: You should remain isolated in your home for 10 days from symptom onset AND greater than 72 hours after symptoms resolution (absence of fever without the use of fever-reducing medication and improvement in respiratory symptoms), whichever is longer Get plenty of rest and push fluids Tessalon Perles prescribed for cough Zyrtec-D prescribed for nasal congestion, runny nose, and/or sore throat Flonase prescribed for nasal congestion and runny nose Use medications daily for symptom relief Use OTC medications like ibuprofen or tylenol as needed fever or pain Call or go to the ED if you have any new or worsening symptoms such as fever, worsening cough, shortness of breath, chest tightness, chest pain, turning blue, changes in mental status, etc...  

## 2019-04-25 LAB — NOVEL CORONAVIRUS, NAA: SARS-CoV-2, NAA: NOT DETECTED

## 2019-06-09 ENCOUNTER — Telehealth: Payer: Self-pay | Admitting: Obstetrics and Gynecology

## 2019-06-09 NOTE — Telephone Encounter (Signed)
Left message on pt's personal voice mail to please consider repeating blood work one more time to confirm complete reduction to zero.

## 2019-06-10 ENCOUNTER — Other Ambulatory Visit: Payer: BC Managed Care – PPO

## 2019-06-11 LAB — BETA HCG QUANT (REF LAB): hCG Quant: <1 m[IU]/mL

## 2019-06-11 NOTE — Progress Notes (Signed)
HCG has reduced to zero, congratulations.

## 2019-09-13 ENCOUNTER — Other Ambulatory Visit: Payer: Self-pay

## 2019-09-13 ENCOUNTER — Ambulatory Visit
Admission: EM | Admit: 2019-09-13 | Discharge: 2019-09-13 | Disposition: A | Discharge status: Home / Self Care | Payer: BC Managed Care – PPO

## 2019-09-13 ENCOUNTER — Encounter: Payer: Self-pay | Admitting: Emergency Medicine

## 2019-09-13 DIAGNOSIS — Z20828 Contact with and (suspected) exposure to other viral communicable diseases: Secondary | ICD-10-CM | POA: Diagnosis not present

## 2019-09-13 DIAGNOSIS — Z1152 Encounter for screening for COVID-19: Secondary | ICD-10-CM

## 2019-09-13 MED ORDER — CETIRIZINE HCL 10 MG PO TABS: 10.0000 mg | ORAL_TABLET | Freq: Every day | ORAL | 0 refills | Status: DC

## 2019-09-13 MED ORDER — BENZONATATE 100 MG PO CAPS: 100.0000 mg | ORAL_CAPSULE | Freq: Three times a day (TID) | ORAL | 0 refills | Status: DC

## 2019-09-13 MED ORDER — FLUTICASONE PROPIONATE 50 MCG/ACT NA SUSP: 1.0000 | Freq: Every day | NASAL | 0 refills | Status: DC

## 2019-09-13 NOTE — Discharge Instructions (Addendum)
COVID testing ordered.  It will take between 2-7 days for test results.  Someone will contact you regarding abnormal results.    In the meantime: You should remain isolated in your home for 10 days from symptom onset AND greater than 24 hours after symptoms resolution (absence of fever without the use of fever-reducing medication and improvement in respiratory symptoms), whichever is longer Get plenty of rest and push fluids Tessalon Perles prescribed for cough Zyrtec-D prescribed for nasal congestion, runny nose, and/or sore throat Flonase prescribed for nasal congestion and runny nose Use medications daily for symptom relief Use OTC medications like ibuprofen or tylenol as needed fever or pain Call or go to the ED if you have any new or worsening symptoms such as fever, worsening cough, shortness of breath, chest tightness, chest pain, turning blue, changes in mental status, etc...  

## 2019-09-13 NOTE — ED Triage Notes (Signed)
Cough and sinus drainage since 3/25.  Family member with similar symptoms and has tested negative for covid.  Called in to work, needs a covid test

## 2019-09-13 NOTE — ED Provider Notes (Addendum)
[facility]    CSN: [id number] Arrival date & time: 09/13/19  0845      History   Chief Complaint Chief Complaint  Patient presents with  . Cough    HPI Amy Fuller is a 19 y.o. female.   Who presented to the urgent care for complaint of cough and sinus drainage for the past 2 days.  Denies sick exposure to COVID, flu or strep.  Denies recent travel.  Denies aggravating or alleviating symptoms.  Denies previous COVID infection.   Denies fever, chills, fatigue, nasal congestion,  sore throat, SOB, wheezing, chest pain, nausea, vomiting, changes in bowel or bladder habits.       Past Medical History:  Diagnosis Date  . Allergy   . Asthma   . Frequent headaches   . Pneumonia 2004  . Vision abnormalities     Patient Active Problem List   Diagnosis Date Noted  . GAD (generalized anxiety disorder) 12/18/2017  . Deliberate self-cutting 12/18/2017  . Insomnia 12/15/2015  . MDD (major depressive disorder), recurrent episode, severe ([place]) 12/13/2015  . Hearing problem of both ears 09/03/2013  . Exercise-induced asthma 09/03/2013  . Hyperacusis of both ears 09/03/2013    Past Surgical History:  Procedure Laterality Date  . DILATION AND CURETTAGE OF UTERUS N/A 12/13/2018   Procedure: SUCTION DILATATION AND CURETTAGE;  Surgeon: Jonnie Kind, MD;  Location: [facility];  Service: Gynecology;  Laterality: N/A;    OB History    Gravida  1   Para      Term      Preterm      AB  1   Living        SAB      TAB      Ectopic      Multiple      Live Births               Home Medications    Prior to Admission medications   Medication Sig Start Date End Date Taking? Authorizing Provider  Pseudoeph-Doxylamine-DM-APAP (NYQUIL PO) Take by mouth.   Yes [provider]  Pseudoephedrine-APAP-DM (DAYQUIL PO) Take by mouth.   Yes [provider]  acetaminophen (TYLENOL) 500 MG tablet Take 500 mg by mouth every 6 (six) hours as  needed for mild pain or moderate pain.    [provider]  benzonatate (TESSALON) 100 MG capsule Take 1 capsule (100 mg total) by mouth every 8 (eight) hours. 09/13/19   Toriana Sponsel, Darrelyn Hillock, FNP  cetirizine (ZYRTEC ALLERGY) 10 MG tablet Take 1 tablet (10 mg total) by mouth daily. 09/13/19   Lauralye Kinn, Darrelyn Hillock, FNP  cetirizine-pseudoephedrine (ZYRTEC-D) 5-120 MG tablet Take 1 tablet by mouth daily. 04/24/19   Wurst, [place], PA-C  fluticasone (FLONASE) 50 MCG/ACT nasal spray Place 1 spray into both nostrils daily for 14 days. 09/13/19 09/27/19  Raygan Skarda, Darrelyn Hillock, FNP  citalopram (CELEXA) 10 MG tablet Take 1 tablet (10 mg total) by mouth daily. Patient not taking: Reported on 01/29/2019 01/20/19 04/24/19  Alycia Rossetti, MD    Family History Family History  Problem Relation Age of Onset  . Miscarriages / [place] Mother   . Arthritis Maternal Grandmother   . Heart disease Maternal Grandfather   . Hypertension Maternal Grandfather   . Cancer Paternal Grandmother   . Cancer Paternal Grandfather     Social History Social History   Tobacco Use  . Smoking status: Passive Smoke Exposure - Never Smoker  .  Smokeless tobacco: Never Used  Substance Use Topics  . Alcohol use: No  . Drug use: Not Currently    Types: Marijuana     Allergies   Patient has no known allergies.   Review of Systems Review of Systems  Constitutional: Negative.   HENT: Positive for rhinorrhea and sinus pressure.   Respiratory: Positive for cough.   Cardiovascular: Negative.   Gastrointestinal: Negative.   Neurological: Negative.   All other systems reviewed and are negative.    Physical Exam Triage Vital Signs ED Triage Vitals  Enc Vitals Group     BP 09/13/19 0902 112/73     Pulse Rate 09/13/19 0902 80     Resp 09/13/19 0902 18     Temp 09/13/19 0902 98.5 F (36.9 C)     Temp src --      SpO2 09/13/19 0902 96 %     Weight --      Height --      Head Circumference --      Peak Flow --       Pain Score 09/13/19 0859 0     Pain Loc --      Pain Edu? --      Excl. in GC? --    No data found.  Updated Vital Signs BP 112/73 (BP Location: Right Arm)   Pulse 80   Temp 98.5 F (36.9 C)   Resp 18   SpO2 96%   Visual Acuity Right Eye Distance:   Left Eye Distance:   Bilateral Distance:    Right Eye Near:   Left Eye Near:    Bilateral Near:     Physical Exam Vitals and nursing note reviewed.  Constitutional:      General: She is not in acute distress.    Appearance: Normal appearance. She is normal weight. She is not ill-appearing or toxic-appearing.  HENT:     Head: Normocephalic.     Right Ear: Tympanic membrane, ear canal and external ear normal. There is no impacted cerumen.     Left Ear: Tympanic membrane, ear canal and external ear normal. There is no impacted cerumen.     Nose: Nose normal. No congestion.     Mouth/Throat:     Mouth: Mucous membranes are moist.     Pharynx: Oropharynx is clear. No oropharyngeal exudate or posterior oropharyngeal erythema.  Cardiovascular:     Rate and Rhythm: Normal rate and regular rhythm.     Pulses: Normal pulses.     Heart sounds: Normal heart sounds. No murmur.  Pulmonary:     Effort: Pulmonary effort is normal. No respiratory distress.     Breath sounds: Normal breath sounds. No wheezing or rhonchi.  Chest:     Chest wall: No tenderness.  Abdominal:     General: Abdomen is flat. Bowel sounds are normal. There is no distension.     Palpations: There is no mass.     Tenderness: There is no abdominal tenderness.  Skin:    Capillary Refill: Capillary refill takes less than 2 seconds.  Neurological:     General: No focal deficit present.     Mental Status: She is alert and oriented to person, place, and time.      UC Treatments / Results  Labs (all labs ordered are listed, but only abnormal results are displayed) Labs Reviewed  NOVEL CORONAVIRUS, NAA    EKG   Radiology No results  found.  Procedures Procedures (including critical care time)  Medications Ordered in UC Medications - No data to display  Initial Impression / Assessment and Plan / UC Course  I have reviewed the triage vital signs and the nursing notes.  Pertinent labs & imaging results that were available during my care of the patient were reviewed by me and considered in my medical decision making (see chart for details).    Patient is stable for discharge. COVID-19 test was ordered Was advised to quarantine Flonase, Zyrtec, Tessalon Perles were prescribed Work note was given To return for worsening symptoms  Final Clinical Impressions(s) / UC Diagnoses   Final diagnoses:  Encounter for screening for COVID-19     Discharge Instructions     COVID testing ordered.  It will take between 2-7 days for test results.  Someone will contact you regarding abnormal results.    In the meantime: You should remain isolated in your home for 10 days from symptom onset AND greater than 24 hours after symptoms resolution (absence of fever without the use of fever-reducing medication and improvement in respiratory symptoms), whichever is longer Get plenty of rest and push fluids Tessalon Perles prescribed for cough Zyrtec-D prescribed for nasal congestion, runny nose, and/or sore throat Flonase prescribed for nasal congestion and runny nose Use medications daily for symptom relief Use OTC medications like ibuprofen or tylenol as needed fever or pain Call or go to the ED if you have any new or worsening symptoms such as fever, worsening cough, shortness of breath, chest tightness, chest pain, turning blue, changes in mental status, etc...     ED Prescriptions    Medication Sig Dispense Auth. Provider   cetirizine (ZYRTEC ALLERGY) 10 MG tablet Take 1 tablet (10 mg total) by mouth daily. 30 tablet Marthella Osorno, Zachery Dakins, FNP   benzonatate (TESSALON) 100 MG capsule Take 1 capsule (100 mg total) by mouth every  8 (eight) hours. 21 capsule Shiana Rappleye S, FNP   fluticasone (FLONASE) 50 MCG/ACT nasal spray Place 1 spray into both nostrils daily for 14 days. 16 g Durward Parcel, FNP     PDMP not reviewed this encounter.   Durward Parcel, FNP 09/13/19 0913    Durward Parcel, FNP 09/13/19 [phone]

## 2019-09-14 LAB — SARS-COV-2, NAA 2 DAY TAT

## 2019-09-14 LAB — NOVEL CORONAVIRUS, NAA: SARS-CoV-2, NAA: NOT DETECTED

## 2019-12-20 ENCOUNTER — Ambulatory Visit
Admission: EM | Admit: 2019-12-20 | Discharge: 2019-12-20 | Disposition: A | Discharge status: Home / Self Care | Payer: BC Managed Care – PPO | Attending: Emergency Medicine | Admitting: Emergency Medicine

## 2019-12-20 DIAGNOSIS — S91209A Unspecified open wound of unspecified toe(s) with damage to nail, initial encounter: Secondary | ICD-10-CM | POA: Diagnosis not present

## 2019-12-20 DIAGNOSIS — S91102A Unspecified open wound of left great toe without damage to nail, initial encounter: Secondary | ICD-10-CM | POA: Diagnosis not present

## 2019-12-20 DIAGNOSIS — S99922A Unspecified injury of left foot, initial encounter: Secondary | ICD-10-CM | POA: Diagnosis not present

## 2019-12-20 MED ORDER — MUPIROCIN 2 % EX OINT: 1.0000 "application " | TOPICAL_OINTMENT | Freq: Two times a day (BID) | CUTANEOUS | 0 refills | Status: DC

## 2019-12-20 NOTE — Discharge Instructions (Addendum)
Keep wound clean with water and mild soap Apply thin layer of mupirocin ointment daily Follow-up with PCP Return or go to ED for worsening of symptoms

## 2019-12-20 NOTE — ED Provider Notes (Signed)
[facility]   [id number] 12/20/19 Arrival Time: 0955   Chief Complaint  Patient presents with  . Foot Injury     SUBJECTIVE: History from: patient.  Amy Fuller is a 19 y.o. female who presents to the urgent care with a complaint left great toe pain and swelling for the past 1 day.  Reports she dropped a dresser on the left great toe.  She localized pain to the left great toe.  She describes the pain as constant and achy.  She has tried OTC medications without relief.  Her symptoms are made worse with ROM.  She denies similar symptoms in the past.  Denies chills, fever, nausea, vomiting, diarrhea.  Tetanus is up-to-date    ROS: As per HPI.  All other pertinent ROS negative.     Past Medical History:  Diagnosis Date  . Allergy   . Asthma   . Frequent headaches   . Pneumonia 2004  . Vision abnormalities    Past Surgical History:  Procedure Laterality Date  . DILATION AND CURETTAGE OF UTERUS N/A 12/13/2018   Procedure: SUCTION DILATATION AND CURETTAGE;  Surgeon: Tilda Burrow, MD;  Location: [facility];  Service: Gynecology;  Laterality: N/A;   No Known Allergies No current facility-administered medications on file prior to encounter.   Current Outpatient Medications on File Prior to Encounter  Medication Sig Dispense Refill  . acetaminophen (TYLENOL) 500 MG tablet Take 500 mg by mouth every 6 (six) hours as needed for mild pain or moderate pain.    . benzonatate (TESSALON) 100 MG capsule Take 1 capsule (100 mg total) by mouth every 8 (eight) hours. 21 capsule 0  . cetirizine (ZYRTEC ALLERGY) 10 MG tablet Take 1 tablet (10 mg total) by mouth daily. 30 tablet 0  . cetirizine-pseudoephedrine (ZYRTEC-D) 5-120 MG tablet Take 1 tablet by mouth daily. 30 tablet 0  . fluticasone (FLONASE) 50 MCG/ACT nasal spray Place 1 spray into both nostrils daily for 14 days. 16 g 0  . Pseudoeph-Doxylamine-DM-APAP (NYQUIL PO) Take by mouth.    . Pseudoephedrine-APAP-DM (DAYQUIL PO)  Take by mouth.    . [DISCONTINUED] citalopram (CELEXA) 10 MG tablet Take 1 tablet (10 mg total) by mouth daily. (Patient not taking: Reported on 01/29/2019) 30 tablet 1   Social History   Socioeconomic History  . Marital status: Single    Spouse name: Not on file  . Number of children: 0  . Years of education: Not on file  . Highest education level: Not on file  Occupational History  . Not on file  Tobacco Use  . Smoking status: Passive Smoke Exposure - Never Smoker  . Smokeless tobacco: Never Used  Vaping Use  . Vaping Use: Every day  Substance and Sexual Activity  . Alcohol use: No  . Drug use: Not Currently    Types: Marijuana  . Sexual activity: Yes    Birth control/protection: I.U.D.  Other Topics Concern  . Not on file  Social History Narrative  . Not on file   Social Determinants of Health   Financial Resource Strain:   . Difficulty of Paying Living Expenses:   Food Insecurity:   . Worried About Programme researcher, broadcasting/film/video in the Last Year:   . Barista in the Last Year:   Transportation Needs:   . Freight forwarder (Medical):   Marland Kitchen Lack of Transportation (Non-Medical):   Physical Activity:   . Days of Exercise per Week:   . Minutes  of Exercise per Session:   Stress:   . Feeling of Stress :   Social Connections:   . Frequency of Communication with Friends and Family:   . Frequency of Social Gatherings with Friends and Family:   . Attends Religious Services:   . Active Member of Clubs or Organizations:   . Attends Banker Meetings:   Marland Kitchen Marital Status:   Intimate Partner Violence:   . Fear of Current or Ex-Partner:   . Emotionally Abused:   Marland Kitchen Physically Abused:   . Sexually Abused:    Family History  Problem Relation Age of Onset  . Miscarriages / [place] Mother   . Arthritis Maternal Grandmother   . Heart disease Maternal Grandfather   . Hypertension Maternal Grandfather   . Cancer Paternal Grandmother   . Cancer Paternal  Grandfather     OBJECTIVE:  Vitals:   12/20/19 1006  BP: 107/85  Pulse: 85  Resp: 18  Temp: 98.4 F (36.9 C)  SpO2: 97%     Physical Exam Vitals and nursing note reviewed.  Constitutional:      General: She is not in acute distress.    Appearance: Normal appearance. She is normal weight. She is not ill-appearing, toxic-appearing or diaphoretic.  Cardiovascular:     Rate and Rhythm: Normal rate and regular rhythm.     Pulses: Normal pulses.     Heart sounds: Normal heart sounds. No murmur heard.  No friction rub. No gallop.   Pulmonary:     Effort: Pulmonary effort is normal. No respiratory distress.     Breath sounds: Normal breath sounds. No stridor. No wheezing, rhonchi or rales.  Chest:     Chest wall: No tenderness.  Musculoskeletal:        General: Tenderness present.     Right foot: Normal.     Left foot: No swelling or tenderness.       Feet:     Comments: Patient is able to ambulate and bear weight with pain. Surface trauma, and nail avulsion present.  Normal range of motion. The left foot is without any obvious deformity or asymmetry when compared to the right foot.  Neurovascular status is intact.   Feet:     Comments: Nail avulsion present Skin:    Findings: Wound present.  Neurological:     Mental Status: She is alert.      LABS:  No results found for this or any previous visit (from the past 24 hour(s)).   ASSESSMENT & PLAN:  1. Avulsion of toenail, initial encounter   2. Injury of left foot, initial encounter   3. Open wound of left great toe, initial encounter     Meds ordered this encounter  Medications  . mupirocin ointment (BACTROBAN) 2 %    Sig: Apply 1 application topically 2 (two) times daily.    Dispense:  22 g    Refill:  0   Patient is stable at discharge.  Xeroform was applied in office.  There is no sign of infection at this time.  Was advised to follow PCP.  Discharge instructions  Keep wound clean with water and mild  soap Apply thin layer of mupirocin ointment daily Follow-up with PCP Return or go to ED for worsening of symptoms  Reviewed expectations re: course of current medical issues. Questions answered. Outlined signs and symptoms indicating need for more acute intervention. Patient verbalized understanding. After Visit Summary given.      Note: This document was  prepared using Conservation officer, historic buildings and may include unintentional dictation errors.      Durward Parcel, FNP 12/20/19 1035

## 2019-12-20 NOTE — ED Triage Notes (Signed)
Pt presents with left great toe injury after dropping a dresser on it,wrapped in bandage .

## 2020-04-25 IMAGING — US TRANSVAGINAL ULTRASOUND OF PELVIS
1 series · 14 of 25 positions shown · non-contrast
Comparison: 12/09/2018 outside study

CLINICAL DATA: Follow-up molar pregnancy.  Pelvic pain

EXAM:
ULTRASOUND PELVIS TRANSVAGINAL
TECHNIQUE: Transvaginal ultrasound examination of the pelvis was performed
including evaluation of the uterus, ovaries, adnexal regions, and
pelvic cul-de-sac.

[Series 1: transvaginal ultrasound of pelvis · 0.09mm/px · 14 of 125 slices shown]
[im 1/125]
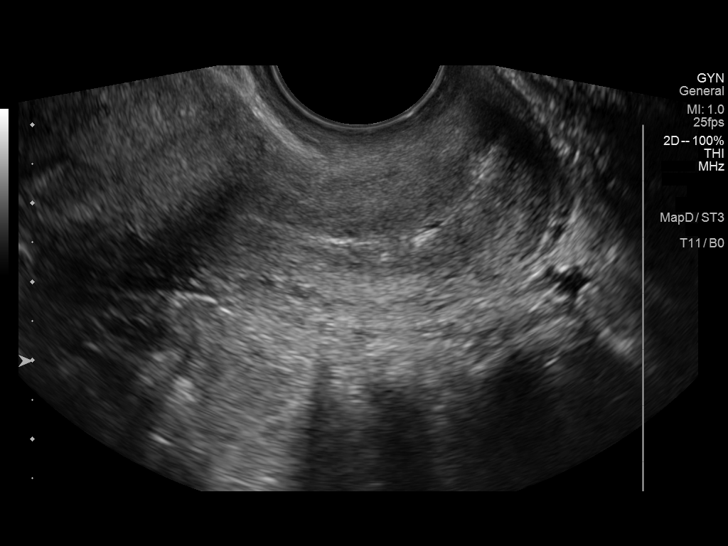
[im 11/125]
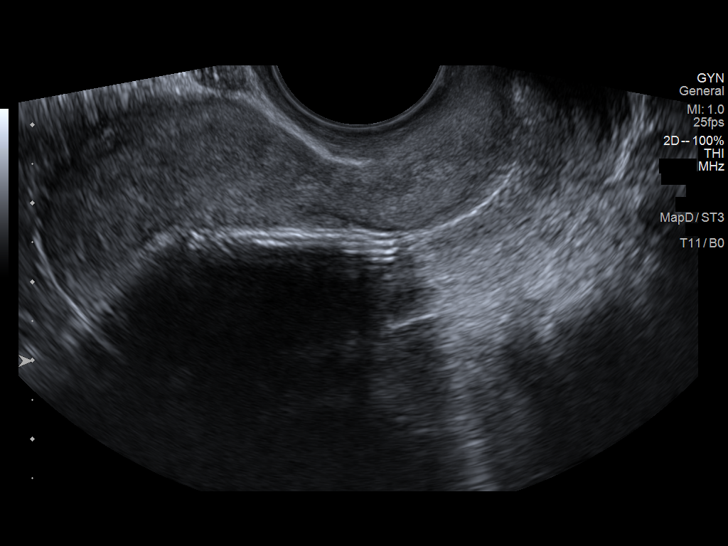
[im 21/125]
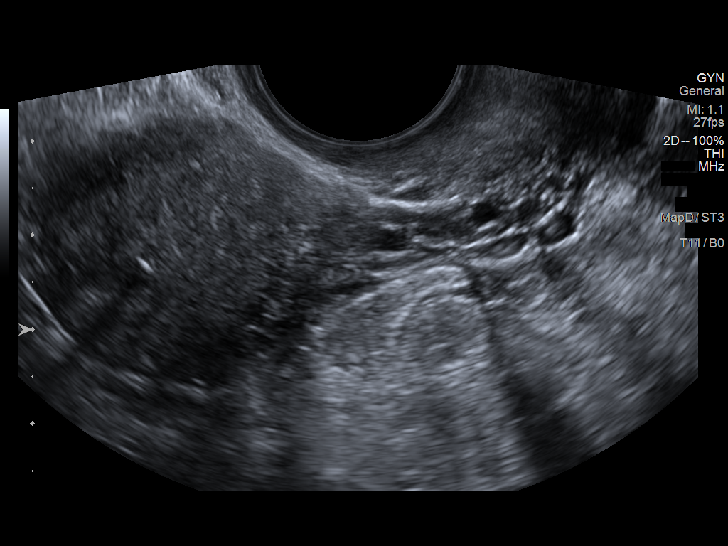
[im 32/125]
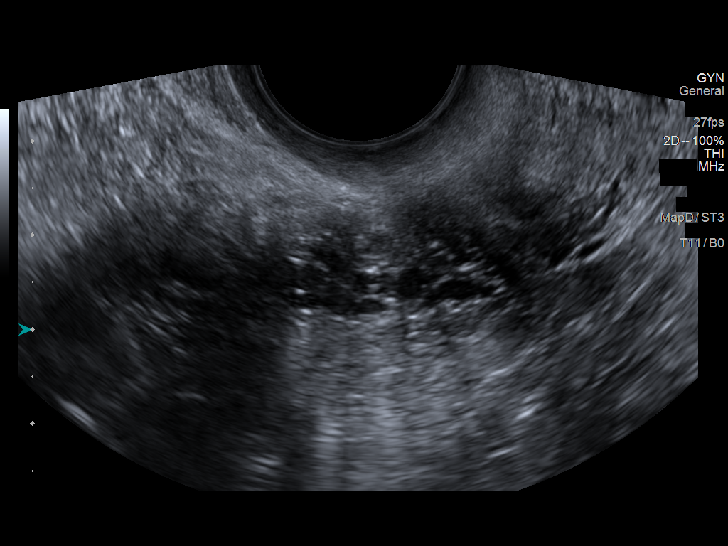
[im 42/125]
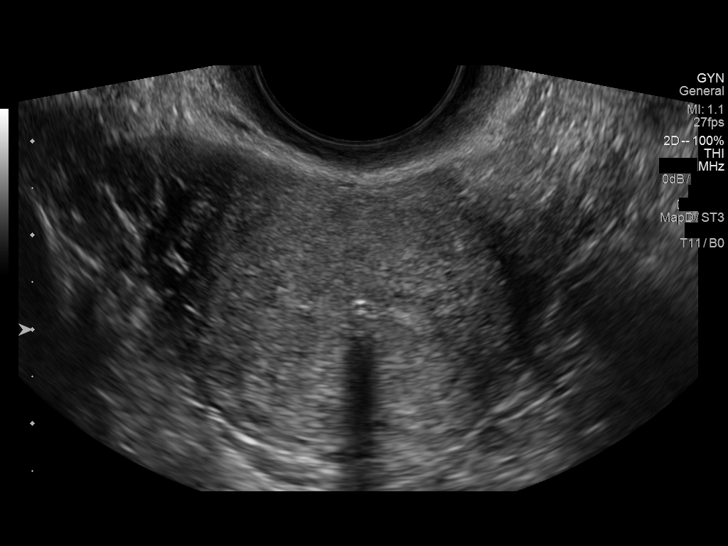
[im 47/125]
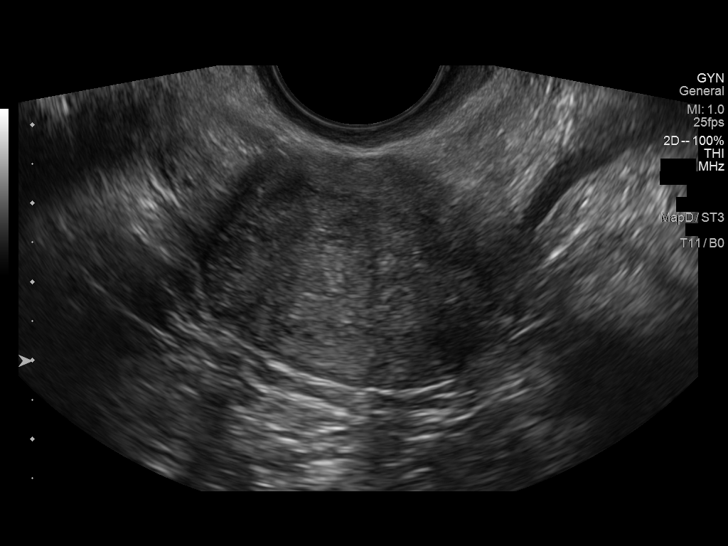
[im 57/125]
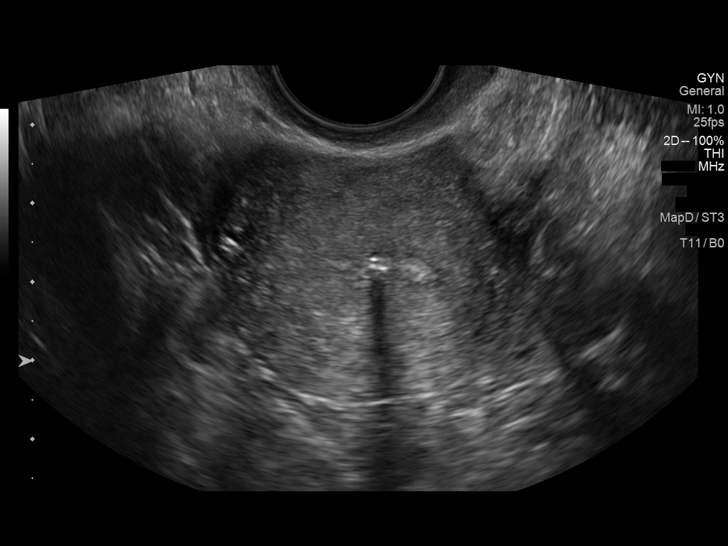
[im 68/125]
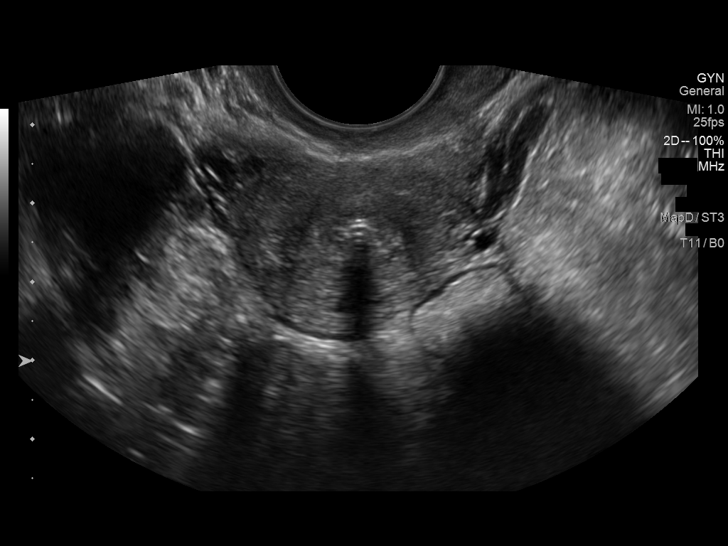
[im 78/125]
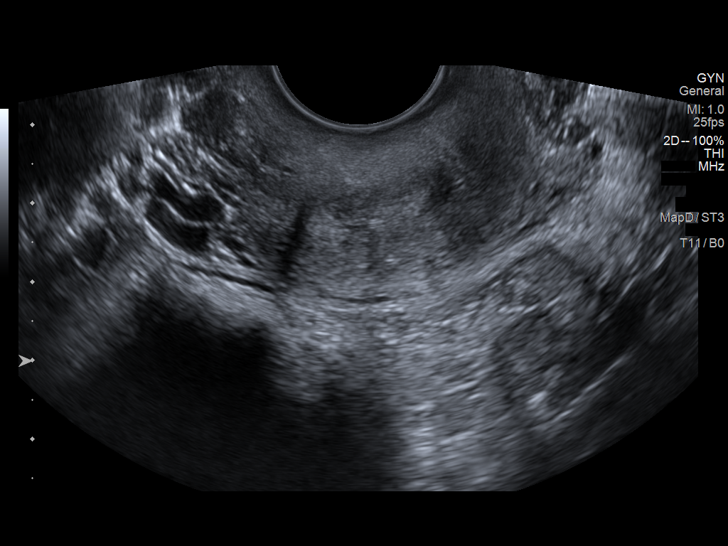
[im 83/125]
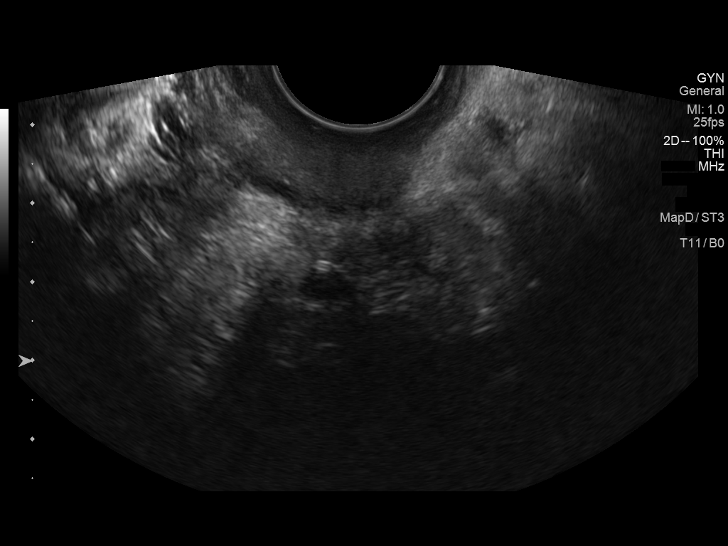
[im 94/125]
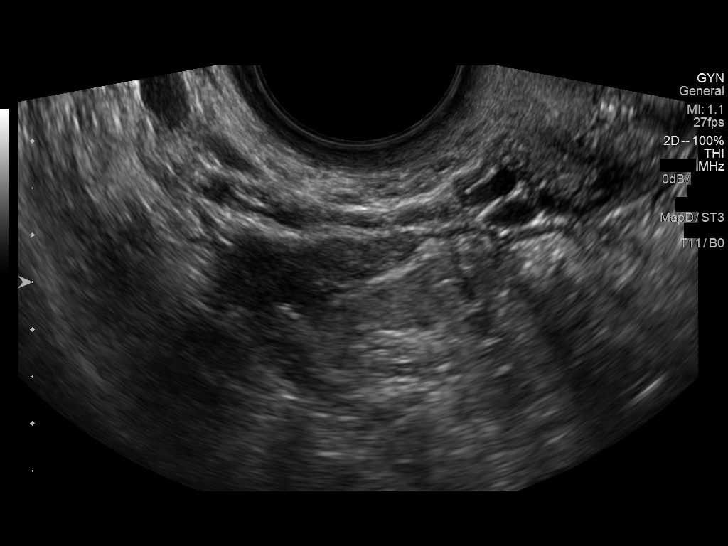
[im 104/125]
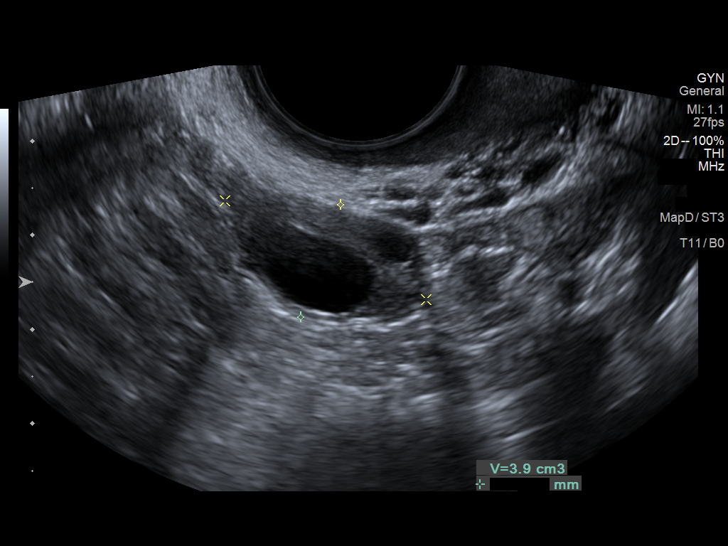
[im 114/125]
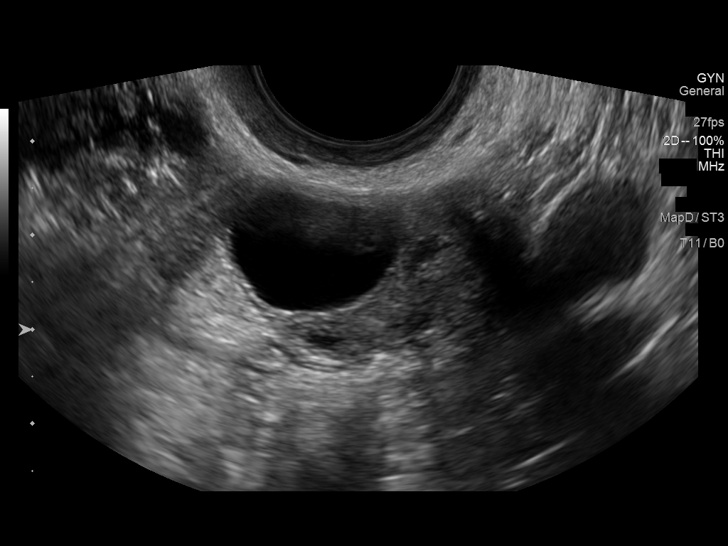
[im 125/125]
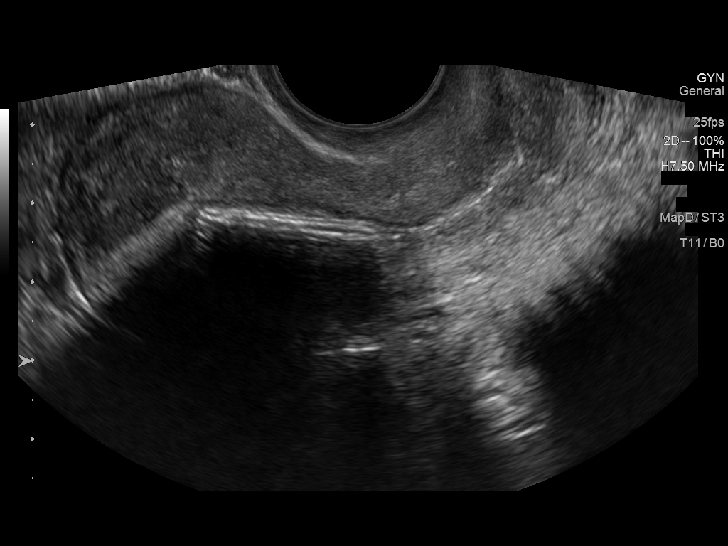

[14 of 25 positions shown; findings below may reference images not displayed]

FINDINGS: Uterus

Measurements: 6.4 x 3.3 x 3.7 cm = volume: 40.8 mL. No fibroids or
other mass visualized.

Endometrium

Thickness: 2 mm in thickness. IUD noted in place. Previously seen
complex cystic areas and thickening of the endometrium no longer
visualized.

Right ovary

Measurements: 2.4 x 2.5 x 1.3 cm = volume: 3.9 mL. Normal
appearance/no adnexal mass.

Left ovary

Measurements: 2.3 x 2.8 x 1.7 cm = volume: 5.6 mL. Normal
appearance/no adnexal mass.

Other findings:  Trace free fluid in the pelvis.
IMPRESSION: Endometrium has a normal appearance and thickness. IUD noted in
expected position.

No acute findings.

## 2020-08-03 DIAGNOSIS — Z20822 Contact with and (suspected) exposure to covid-19: Secondary | ICD-10-CM | POA: Diagnosis not present

## 2020-08-03 DIAGNOSIS — J9801 Acute bronchospasm: Secondary | ICD-10-CM | POA: Diagnosis not present

## 2020-08-03 DIAGNOSIS — R059 Cough, unspecified: Secondary | ICD-10-CM | POA: Diagnosis not present

## 2020-08-30 DIAGNOSIS — J4521 Mild intermittent asthma with (acute) exacerbation: Secondary | ICD-10-CM | POA: Diagnosis not present

## 2020-08-30 DIAGNOSIS — J9801 Acute bronchospasm: Secondary | ICD-10-CM | POA: Diagnosis not present

## 2020-09-05 ENCOUNTER — Encounter: Payer: Self-pay | Admitting: Emergency Medicine

## 2020-09-05 ENCOUNTER — Ambulatory Visit
Admission: EM | Admit: 2020-09-05 | Discharge: 2020-09-05 | Disposition: A | Discharge status: Home / Self Care | Payer: BC Managed Care – PPO

## 2020-09-05 ENCOUNTER — Other Ambulatory Visit: Payer: Self-pay

## 2020-09-05 DIAGNOSIS — R062 Wheezing: Secondary | ICD-10-CM | POA: Diagnosis not present

## 2020-09-05 DIAGNOSIS — J45901 Unspecified asthma with (acute) exacerbation: Secondary | ICD-10-CM

## 2020-09-05 DIAGNOSIS — H66012 Acute suppurative otitis media with spontaneous rupture of ear drum, left ear: Secondary | ICD-10-CM

## 2020-09-05 DIAGNOSIS — R112 Nausea with vomiting, unspecified: Secondary | ICD-10-CM

## 2020-09-05 DIAGNOSIS — J4520 Mild intermittent asthma, uncomplicated: Secondary | ICD-10-CM | POA: Diagnosis not present

## 2020-09-05 MED ORDER — ONDANSETRON 4 MG PO TBDP: 4.0000 mg | ORAL_TABLET | Freq: Three times a day (TID) | ORAL | 0 refills | Status: DC | PRN

## 2020-09-05 MED ORDER — PREDNISONE 20 MG PO TABS: 20.0000 mg | ORAL_TABLET | Freq: Every day | ORAL | 0 refills | Status: AC

## 2020-09-05 MED ORDER — DEXAMETHASONE 1 MG/ML PO CONC: 10.0000 mg | Freq: Once | ORAL | Status: DC

## 2020-09-05 MED ORDER — DEXAMETHASONE SODIUM PHOSPHATE 10 MG/ML IJ SOLN
10.0000 mg | Freq: Once | INTRAMUSCULAR | Status: AC
  Administered 2020-09-05: 10 mg via INTRAMUSCULAR

## 2020-09-05 MED ORDER — IPRATROPIUM-ALBUTEROL 0.5-2.5 (3) MG/3ML IN SOLN: 3.0000 mL | Freq: Four times a day (QID) | RESPIRATORY_TRACT | 0 refills | Status: DC | PRN

## 2020-09-05 MED ORDER — CEFDINIR 300 MG PO CAPS: 300.0000 mg | ORAL_CAPSULE | Freq: Two times a day (BID) | ORAL | 0 refills | Status: DC

## 2020-09-05 NOTE — ED Triage Notes (Signed)
State she was covid positive a month ago.  Has had contact with her brother who tested positive for flu.

## 2020-09-05 NOTE — Discharge Instructions (Addendum)
Start nebulizer treatments every 6 hours as needed while wheezing remains active then administer only as needed.  Start oral prednisone tomorrow as you received a dose here at urgent care today.  Start antibiotics today.  Keep albuterol with you at all times to use in event you experience wheezing when unable to take breathing treatments

## 2020-09-05 NOTE — ED Triage Notes (Signed)
Was seen at another urgent care for cough and wheezing was placed on prednisone.  States ears have started to hurt and she is dizzy and nauseated.

## 2020-09-05 NOTE — ED Provider Notes (Signed)
[facility]    CSN: [id number] Arrival date & time: 09/05/20  1130      History   Chief Complaint No chief complaint on file.   HPI Amy Fuller is a 20 y.o. female.   HPI  Patient seen at different urgent care 6 days ago and placed on prednisone for acute asthma exacerbation and wheezing. She reports over the week wheezing has worsened and she developed some mild ear pain on Wednesday which today she characterizes as severe 10 out of 10 left ear pain.  She reports a history of recurrent issues with ear infections and inner ear problems.  She is not having any pain in the right ear.  Her cough has mildly improved but she continues to have wheezing which is in close at nighttime.  She has not had a fever or any headache.  In general her asthma is stable although she endorses having acute exacerbations throughout the year with changes in weather.  She endorses use of her albuterol inhaler however had a nebulizer machine sometime ago however it is not currently working.  Due to her ear pain she is having nausea and dizziness with ambulation.  The dizziness and nausea has been ongoing for the last 2 days and today it worsened.  Past Medical History:  Diagnosis Date  . Allergy   . Asthma   . Frequent headaches   . Pneumonia 2004  . Vision abnormalities     Patient Active Problem List   Diagnosis Date Noted  . GAD (generalized anxiety disorder) 12/18/2017  . Deliberate self-cutting 12/18/2017  . Insomnia 12/15/2015  . MDD (major depressive disorder), recurrent episode, severe (HCC) 12/13/2015  . Hearing problem of both ears 09/03/2013  . Exercise-induced asthma 09/03/2013  . Hyperacusis of both ears 09/03/2013    Past Surgical History:  Procedure Laterality Date  . DILATION AND CURETTAGE OF UTERUS N/A 12/13/2018   Procedure: SUCTION DILATATION AND CURETTAGE;  Surgeon: Tilda Burrow, MD;  Location: [facility];  Service: Gynecology;  Laterality: N/A;    OB History     Gravida  1   Para      Term      Preterm      AB  1   Living        SAB      IAB      Ectopic      Multiple      Live Births               Home Medications    Prior to Admission medications   Medication Sig Start Date End Date Taking? Authorizing Provider  predniSONE (DELTASONE) 20 MG tablet Take 20 mg by mouth daily with breakfast.   Yes [provider]  acetaminophen (TYLENOL) 500 MG tablet Take 500 mg by mouth every 6 (six) hours as needed for mild pain or moderate pain.    [provider]  benzonatate (TESSALON) 100 MG capsule Take 1 capsule (100 mg total) by mouth every 8 (eight) hours. 09/13/19   Avegno, Zachery Dakins, FNP  cetirizine (ZYRTEC ALLERGY) 10 MG tablet Take 1 tablet (10 mg total) by mouth daily. 09/13/19   Avegno, Zachery Dakins, FNP  cetirizine-pseudoephedrine (ZYRTEC-D) 5-120 MG tablet Take 1 tablet by mouth daily. 04/24/19   Wurst, [place], PA-C  fluticasone (FLONASE) 50 MCG/ACT nasal spray Place 1 spray into both nostrils daily for 14 days. 09/13/19 09/27/19  Avegno, Zachery Dakins, FNP  mupirocin ointment (BACTROBAN) 2 %  Apply 1 application topically 2 (two) times daily. 12/20/19   Avegno, Zachery Dakins, FNP  Pseudoeph-Doxylamine-DM-APAP (NYQUIL PO) Take by mouth.    [provider]  Pseudoephedrine-APAP-DM (DAYQUIL PO) Take by mouth.    [provider]  citalopram (CELEXA) 10 MG tablet Take 1 tablet (10 mg total) by mouth daily. Patient not taking: Reported on 01/29/2019 01/20/19 04/24/19  Salley Scarlet, MD    Family History Family History  Problem Relation Age of Onset  . Miscarriages / [place] Mother   . Arthritis Maternal Grandmother   . Heart disease Maternal Grandfather   . Hypertension Maternal Grandfather   . Cancer Paternal Grandmother   . Cancer Paternal Grandfather     Social History Social History   Tobacco Use  . Smoking status: Passive Smoke Exposure - Never Smoker  . Smokeless tobacco: Never  Used  Vaping Use  . Vaping Use: Every day  Substance Use Topics  . Alcohol use: No  . Drug use: Not Currently    Types: Marijuana     Allergies   Patient has no known allergies.   Review of Systems Review of Systems Pertinent negatives listed in HPI  Physical Exam Triage Vital Signs ED Triage Vitals  Enc Vitals Group     BP 09/05/20 1137 126/89     Pulse Rate 09/05/20 1137 (!) 102     Resp 09/05/20 1137 16     Temp 09/05/20 1137 97.9 F (36.6 C)     Temp Source 09/05/20 1137 Tympanic     SpO2 09/05/20 1137 95 %     Weight --      Height --      Head Circumference --      Peak Flow --      Pain Score 09/05/20 1145 6     Pain Loc --      Pain Edu? --      Excl. in GC? --    No data found.  Updated Vital Signs BP 126/89 (BP Location: Right Arm)   Pulse (!) 102   Temp 97.9 F (36.6 C) (Tympanic)   Resp 16   SpO2 95%   Visual Acuity Right Eye Distance:   Left Eye Distance:   Bilateral Distance:    Right Eye Near:   Left Eye Near:    Bilateral Near:     Physical Exam Constitutional:      Appearance: She is ill-appearing.  HENT:     Right Ear: Hearing, tympanic membrane, ear canal and external ear normal.     Left Ear: Swelling and tenderness present. Tympanic membrane is perforated, erythematous and bulging.     Nose: Mucosal edema, congestion and rhinorrhea present. Rhinorrhea is clear.  Cardiovascular:     Rate and Rhythm: Regular rhythm. Tachycardia present.  Pulmonary:     Breath sounds: Wheezing present.     Comments: Diffuse bilateral wheezing.  Coarse lung sounds present  Musculoskeletal:     Cervical back: Normal range of motion.  Lymphadenopathy:     Cervical: No cervical adenopathy.  Neurological:     Mental Status: She is alert.  Psychiatric:        Attention and Perception: Attention normal.        Mood and Affect: Mood normal.        Speech: Speech normal.        Behavior: Behavior normal.     UC Treatments / Results   Labs (all labs ordered are listed, but only abnormal  results are displayed) Labs Reviewed - No data to display  EKG   Radiology No results found.  Procedures Procedures (including critical care time)  Medications Ordered in UC Medications  dexamethasone (DECADRON) 1 MG/ML solution 10 mg (has no administration in time range)    Initial Impression / Assessment and Plan / UC Course  I have reviewed the triage vital signs and the nursing notes.  Pertinent labs & imaging results that were available during my care of the patient were reviewed by me and considered in my medical decision making (see chart for details).      For asthma exacerbation extending prednisone 20 mg once daily for 5 days patient will start dose tomorrow.  At the urgent care she was only prescribed 20 mg daily for 5 days without any improvement of asthma instead of the standard dose of 40 mg daily for 5 days.  Patient given Decadron 10 mg IM here in clinic today as she has diffuse wheezing.  Home nebulizer machine was also dispensed here in clinic today and prescribed DuoNeb treatments to use every 6 hours for active wheezing shortness of breath.  Patient instructed to continue use of her albuterol inhaler when away from home and unable to perform nebulizer treatments if any wheezing develops.  Patient has a left ear infection with spontaneous rupture of the membrane covering with Omnicef 300 mg twice daily for 10 days.  For nausea related to inner ear treating with Zofran 4 mg every 8 hours as needed.  Strict follow-up precautions given if symptoms worsen and do not improve. Final Clinical Impressions(s) / UC Diagnoses   Final diagnoses:  Wheezing  Non-recurrent acute suppurative otitis media of left ear with spontaneous rupture of tympanic membrane  Nausea and vomiting, intractability of vomiting not specified, unspecified vomiting type   Discharge Instructions   None    ED Prescriptions    Medication Sig  Dispense Auth. Provider   predniSONE (DELTASONE) 20 MG tablet Take 1 tablet (20 mg total) by mouth daily with breakfast for 5 days. 5 tablet Bing Neighbors, FNP   ipratropium-albuterol (DUONEB) 0.5-2.5 (3) MG/3ML SOLN Take 3 mLs by nebulization every 6 (six) hours as needed. 360 mL Bing Neighbors, FNP   cefdinir (OMNICEF) 300 MG capsule Take 1 capsule (300 mg total) by mouth 2 (two) times daily for 10 days. 20 capsule Bing Neighbors, FNP   ondansetron (ZOFRAN ODT) 4 MG disintegrating tablet Take 1 tablet (4 mg total) by mouth every 8 (eight) hours as needed for nausea or vomiting. 20 tablet Bing Neighbors, FNP     PDMP not reviewed this encounter.   Bing Neighbors, FNP 09/05/20 [phone]

## 2020-09-09 ENCOUNTER — Encounter: Payer: Self-pay | Admitting: Emergency Medicine

## 2020-09-09 ENCOUNTER — Other Ambulatory Visit: Payer: Self-pay

## 2020-09-09 ENCOUNTER — Ambulatory Visit (INDEPENDENT_AMBULATORY_CARE_PROVIDER_SITE_OTHER): Payer: BC Managed Care – PPO

## 2020-09-09 ENCOUNTER — Ambulatory Visit
Admission: EM | Admit: 2020-09-09 | Discharge: 2020-09-09 | Disposition: A | Discharge status: Home / Self Care | Payer: BC Managed Care – PPO | Attending: Emergency Medicine | Admitting: Emergency Medicine

## 2020-09-09 DIAGNOSIS — R062 Wheezing: Secondary | ICD-10-CM | POA: Diagnosis not present

## 2020-09-09 DIAGNOSIS — R059 Cough, unspecified: Secondary | ICD-10-CM | POA: Diagnosis not present

## 2020-09-09 DIAGNOSIS — J22 Unspecified acute lower respiratory infection: Secondary | ICD-10-CM

## 2020-09-09 DIAGNOSIS — H66012 Acute suppurative otitis media with spontaneous rupture of ear drum, left ear: Secondary | ICD-10-CM

## 2020-09-09 DIAGNOSIS — R509 Fever, unspecified: Secondary | ICD-10-CM | POA: Diagnosis not present

## 2020-09-09 MED ORDER — CEFTRIAXONE SODIUM 1 G IJ SOLR
1.0000 g | Freq: Once | INTRAMUSCULAR | Status: AC
  Administered 2020-09-09: 1 g via INTRAMUSCULAR

## 2020-09-09 MED ORDER — AZITHROMYCIN 250 MG PO TABS: 250.0000 mg | ORAL_TABLET | Freq: Every day | ORAL | 0 refills | Status: DC

## 2020-09-09 MED ORDER — DEXAMETHASONE SODIUM PHOSPHATE 10 MG/ML IJ SOLN
10.0000 mg | Freq: Once | INTRAMUSCULAR | Status: AC
  Administered 2020-09-09: 10 mg via INTRAMUSCULAR

## 2020-09-09 MED ORDER — AMOXICILLIN-POT CLAVULANATE ER 1000-62.5 MG PO TB12: 2.0000 | ORAL_TABLET | Freq: Two times a day (BID) | ORAL | 0 refills | Status: DC

## 2020-09-09 MED ORDER — IBUPROFEN 800 MG PO TABS
800.0000 mg | ORAL_TABLET | Freq: Once | ORAL | Status: AC
  Administered 2020-09-09: 800 mg via ORAL

## 2020-09-09 NOTE — Discharge Instructions (Signed)
Chest x-ray negative for cardiopulmonary disease, however, based on history and exam I am concerned for lower respiratory infection Will treat with augmentin ER and azithromycin.  This will also cover for ear infection Steroid shot given in office Complete prednisone.   Use nebulizer as needed for wheezing Use OTC medications like ibuprofen or tylenol as needed fever or pain Follow up with PCP next week for recheck Call or go to the ED if you have any new or worsening symptoms such as fever, worsening cough, shortness of breath, chest tightness, chest pain, turning blue, changes in mental status, if symptoms do not improve with treatment over the next 12-24 hours, etc..Marland Kitchen

## 2020-09-09 NOTE — ED Triage Notes (Signed)
Left ear pain for a few days and coughing, wheezing, and fevers.  Was given steroid shot, breathing shot for the house (she had to stop using this because it was making her have panic attacks), and prednisone (tomorrow is the last day).  Also given antibiotic.

## 2020-09-09 NOTE — ED Provider Notes (Signed)
[facility]   [id number] 09/09/20 Arrival Time: 1347   CC: Cough, wheezing, and ear pain  SUBJECTIVE: History from: patient.  Amy Fuller is a 20 y.o. female who presents with non-productive cough, and wheezing x 2 months, LT ear pain x 1 week.  Denies sick exposure to COVID, flu or strep.  Has been seen at Urgent care twice.  Seen last at [facility] on 09/05/20.  Has tried two rounds of prednisone, cough medication, cefdinir, steroid shot, and albuterol nebulizer without relief.  Started with fever last night.  Tmax 102 at home, 100.4 in office.  Denies previous symptoms in the past.   Denies sinus pain, rhinorrhea, sore throat, SOB, chest pain, nausea, changes in bowel or bladder habits.     ROS: As per HPI.  All other pertinent ROS negative.     Past Medical History:  Diagnosis Date  . Allergy   . Asthma   . Frequent headaches   . Pneumonia 2004  . Vision abnormalities    Past Surgical History:  Procedure Laterality Date  . DILATION AND CURETTAGE OF UTERUS N/A 12/13/2018   Procedure: SUCTION DILATATION AND CURETTAGE;  Surgeon: Tilda Burrow, MD;  Location: [facility];  Service: Gynecology;  Laterality: N/A;   No Known Allergies No current facility-administered medications on file prior to encounter.   Current Outpatient Medications on File Prior to Encounter  Medication Sig Dispense Refill  . acetaminophen (TYLENOL) 500 MG tablet Take 500 mg by mouth every 6 (six) hours as needed for mild pain or moderate pain.    . benzonatate (TESSALON) 100 MG capsule Take 1 capsule (100 mg total) by mouth every 8 (eight) hours. 21 capsule 0  . cetirizine (ZYRTEC ALLERGY) 10 MG tablet Take 1 tablet (10 mg total) by mouth daily. 30 tablet 0  . cetirizine-pseudoephedrine (ZYRTEC-D) 5-120 MG tablet Take 1 tablet by mouth daily. 30 tablet 0  . fluticasone (FLONASE) 50 MCG/ACT nasal spray Place 1 spray into both nostrils daily for 14 days. 16 g 0  . ipratropium-albuterol  (DUONEB) 0.5-2.5 (3) MG/3ML SOLN Take 3 mLs by nebulization every 6 (six) hours as needed. 360 mL 0  . mupirocin ointment (BACTROBAN) 2 % Apply 1 application topically 2 (two) times daily. 22 g 0  . ondansetron (ZOFRAN ODT) 4 MG disintegrating tablet Take 1 tablet (4 mg total) by mouth every 8 (eight) hours as needed for nausea or vomiting. 20 tablet 0  . predniSONE (DELTASONE) 20 MG tablet Take 1 tablet (20 mg total) by mouth daily with breakfast for 5 days. 5 tablet 0  . Pseudoeph-Doxylamine-DM-APAP (NYQUIL PO) Take by mouth.    . Pseudoephedrine-APAP-DM (DAYQUIL PO) Take by mouth.    . [DISCONTINUED] citalopram (CELEXA) 10 MG tablet Take 1 tablet (10 mg total) by mouth daily. (Patient not taking: Reported on 01/29/2019) 30 tablet 1   Social History   Socioeconomic History  . Marital status: Single    Spouse name: Not on file  . Number of children: 0  . Years of education: Not on file  . Highest education level: Not on file  Occupational History  . Not on file  Tobacco Use  . Smoking status: Passive Smoke Exposure - Never Smoker  . Smokeless tobacco: Never Used  Vaping Use  . Vaping Use: Every day  Substance and Sexual Activity  . Alcohol use: No  . Drug use: Not Currently    Types: Marijuana  . Sexual activity: Yes  Birth control/protection: I.U.D.  Other Topics Concern  . Not on file  Social History Narrative  . Not on file   Social Determinants of Health   Financial Resource Strain: Not on file  Food Insecurity: Not on file  Transportation Needs: Not on file  Physical Activity: Not on file  Stress: Not on file  Social Connections: Not on file  Intimate Partner Violence: Not on file   Family History  Problem Relation Age of Onset  . Miscarriages / [place] Mother   . Arthritis Maternal Grandmother   . Heart disease Maternal Grandfather   . Hypertension Maternal Grandfather   . Cancer Paternal Grandmother   . Cancer Paternal Grandfather      OBJECTIVE:  Vitals:   09/09/20 1355  BP: 121/83  Pulse: (!) 136  Resp: 16  Temp: (!) 100.4 F (38 C)  TempSrc: Oral  SpO2: 92%  Weight: 150 lb (68 kg)     General appearance: alert; appears fatigued, but nontoxic; speaking in full sentences and tolerating own secretions HEENT: NCAT; Ears: EACs clear, RT TM pearly gray, LT TM erythematous, and tense; Eyes: PERRL.  EOM grossly intact. Nose: nares patent with trace rhinorrhea, Throat: oropharynx clear, tonsils non erythematous or enlarged, uvula midline  Neck: supple without LAD Lungs: unlabored respirations, symmetrical air entry; cough: mild; no respiratory distress; subtle wheezes and crackles heard throughout bilateral lung fields, more prominent over LT upper lobe Heart: Tachycardia Skin: warm and dry Psychological: alert and cooperative; normal mood and affect  DIAGNOSTIC STUDES:  DG Chest 2 View  Result Date: 09/09/2020 CLINICAL DATA:  Cough and fever with wheezing EXAM: CHEST - 2 VIEW COMPARISON:  None. FINDINGS: Lungs are clear. Heart size and pulmonary vascularity are normal. No adenopathy. No bone lesions. IMPRESSION: Lungs clear.  Cardiac silhouette normal. Electronically Signed   By: Bretta Bang III M.D.   On: 09/09/2020 14:24    I have reviewed the x-rays myself and the radiologist interpretation. I am in agreement with the radiologist interpretation.     ASSESSMENT & PLAN:  1. Cough   2. Non-recurrent acute suppurative otitis media of left ear with spontaneous rupture of tympanic membrane   3. Lower respiratory tract infection     Meds ordered this encounter  Medications  . ibuprofen (ADVIL) tablet 800 mg  . azithromycin (ZITHROMAX) 250 MG tablet    Sig: Take 1 tablet (250 mg total) by mouth daily. Take first 2 tablets together, then 1 every day until finished.    Dispense:  6 tablet    Refill:  0    Order Specific Question:   Supervising Provider    Answer:   Eustace Moore [[id number]]  .  amoxicillin-clavulanate (AUGMENTIN XR) 1000-62.5 MG 12 hr tablet    Sig: Take 2 tablets by mouth 2 (two) times daily for 10 days.    Dispense:  40 tablet    Refill:  0    Order Specific Question:   Supervising Provider    Answer:   Eustace Moore [[id number]]  . cefTRIAXone (ROCEPHIN) injection 1 g  . dexamethasone (DECADRON) injection 10 mg    Chest x-ray negative for cardiopulmonary disease, however, based on history and exam I am concerned for lower respiratory infection Will treat with augmentin ER and azithromycin.  This will also cover for ear infection Steroid shot given in office Complete prednisone.   Use nebulizer as needed for wheezing Use OTC medications like ibuprofen or tylenol as needed fever or pain Follow  up with PCP next week for recheck Call or go to the ED if you have any new or worsening symptoms such as fever, worsening cough, shortness of breath, chest tightness, chest pain, turning blue, changes in mental status, if symptoms do not improve with treatment over the next 12-24 hours, etc...   Reviewed expectations re: course of current medical issues. Questions answered. Outlined signs and symptoms indicating need for more acute intervention. Patient verbalized understanding. After Visit Summary given.         Rennis Harding, PA-C 09/09/20 1448

## 2020-09-10 ENCOUNTER — Telehealth: Payer: Self-pay | Admitting: Internal Medicine

## 2020-09-10 MED ORDER — AMOXICILLIN-POT CLAVULANATE 875-125 MG PO TABS: 1.0000 | ORAL_TABLET | Freq: Two times a day (BID) | ORAL | 0 refills | Status: AC

## 2020-09-10 NOTE — Telephone Encounter (Signed)
Augmentin 1000-125mg  not covered by insurance. Augmentin 875-125mg  is covered. This was sent to the pharmacy.

## 2020-09-11 DIAGNOSIS — J1282 Pneumonia due to coronavirus disease 2019: Secondary | ICD-10-CM | POA: Diagnosis not present

## 2020-09-11 DIAGNOSIS — J9601 Acute respiratory failure with hypoxia: Secondary | ICD-10-CM | POA: Diagnosis not present

## 2020-09-11 DIAGNOSIS — J45909 Unspecified asthma, uncomplicated: Secondary | ICD-10-CM | POA: Diagnosis not present

## 2020-09-11 DIAGNOSIS — R0600 Dyspnea, unspecified: Secondary | ICD-10-CM | POA: Diagnosis not present

## 2020-09-11 DIAGNOSIS — R000 Tachycardia, unspecified: Secondary | ICD-10-CM | POA: Diagnosis not present

## 2020-09-11 DIAGNOSIS — J101 Influenza due to other identified influenza virus with other respiratory manifestations: Secondary | ICD-10-CM | POA: Diagnosis not present

## 2020-09-11 DIAGNOSIS — R0902 Hypoxemia: Secondary | ICD-10-CM | POA: Diagnosis not present

## 2020-09-11 DIAGNOSIS — R0602 Shortness of breath: Secondary | ICD-10-CM | POA: Diagnosis not present

## 2020-09-11 DIAGNOSIS — R0603 Acute respiratory distress: Secondary | ICD-10-CM | POA: Diagnosis not present

## 2020-09-11 DIAGNOSIS — U071 COVID-19: Secondary | ICD-10-CM | POA: Diagnosis not present

## 2020-09-11 DIAGNOSIS — J09X2 Influenza due to identified novel influenza A virus with other respiratory manifestations: Secondary | ICD-10-CM | POA: Diagnosis not present

## 2021-01-08 DIAGNOSIS — K219 Gastro-esophageal reflux disease without esophagitis: Secondary | ICD-10-CM | POA: Diagnosis not present

## 2021-01-08 DIAGNOSIS — Z760 Encounter for issue of repeat prescription: Secondary | ICD-10-CM | POA: Diagnosis not present

## 2021-01-08 DIAGNOSIS — H6502 Acute serous otitis media, left ear: Secondary | ICD-10-CM | POA: Diagnosis not present

## 2021-01-08 DIAGNOSIS — K2960 Other gastritis without bleeding: Secondary | ICD-10-CM | POA: Diagnosis not present

## 2021-11-16 ENCOUNTER — Ambulatory Visit
Admission: RE | Admit: 2021-11-16 | Discharge: 2021-11-16 | Disposition: A | Discharge status: Home / Self Care | Payer: BC Managed Care – PPO | Source: Ambulatory Visit

## 2021-11-16 VITALS — BP 119/78 | HR 62 | Temp 98.3°F | Resp 16

## 2021-11-16 DIAGNOSIS — J069 Acute upper respiratory infection, unspecified: Secondary | ICD-10-CM

## 2021-11-16 HISTORY — DX: Depression, unspecified: F32.A

## 2021-11-16 MED ORDER — BENZONATATE 100 MG PO CAPS: 100.0000 mg | ORAL_CAPSULE | Freq: Three times a day (TID) | ORAL | 0 refills | Status: DC

## 2021-11-16 MED ORDER — PROMETHAZINE-DM 6.25-15 MG/5ML PO SYRP: 5.0000 mL | ORAL_SOLUTION | Freq: Four times a day (QID) | ORAL | 0 refills | Status: DC | PRN

## 2021-11-16 MED ORDER — ONDANSETRON 4 MG PO TBDP: 4.0000 mg | ORAL_TABLET | Freq: Three times a day (TID) | ORAL | 0 refills | Status: DC | PRN

## 2021-11-16 NOTE — Discharge Instructions (Addendum)
Your symptoms and exam findings are most consistent with a viral upper respiratory infection. These usually run their course in about 10 days.  If your symptoms last longer than 10 days without improvement, please follow up with your primary care provider.  If your symptoms, worsen, please go to the Emergency Room.    We have tested you today for COVID-19 and influenza.  You will see the results in Mychart and we will call you with positive results.    Please stay home and isolate until you are aware of the results.    Some things that can make you feel better are: - Increased rest - Increasing fluid with water/sugar free electrolytes - Acetaminophen and ibuprofen as needed for fever/pain.  - Salt water gargling, chloraseptic spray and throat lozenges - OTC guaifenesin (Mucinex).  - Saline sinus flushes or a neti pot.  - Humidifying the air.  -You can use Zofran 4 mg every 8 hours as needed under your tongue to help with nausea/vomiting.  You can also use the Tessalon Perles every 8 hours as needed alternating with the promethazine-dextromethorphan.  Do not take these at the same time, and please do not drive or operate heavy machinery while taking them.

## 2021-11-16 NOTE — ED Provider Notes (Signed)
[facility]    CSN: [id number] Arrival date & time: 11/16/21  [id number]      History   Chief Complaint Chief Complaint  Patient presents with   Cough    Cough/congestion/nausea/dizzy/ear pain started Saturday night - Entered by patient   Appointment    0930    HPI Amy Fuller is a 21 y.o. female.   Patient presents with onset of upper respiratory symptoms Sunday.  She reports body aches, night sweats, congested cough, wheezing when laying down, some chest tightness, chest congestion, nasal congestion, postnasal drainage, sinus pressure and headache, left ear pain and pressure, nausea, vomiting, and diarrhea.  She denies shortness of breath, chest pain, sneezing, sore throat, eye redness or itching, new rash.  She reports she has a lot of drainage going on the back and feels this is making her gag and feel nauseous.  She also reports she deals with acid reflux on a regular basis and is not sure if this is exacerbated making her vomit.  She has taken DayQuil and NyQuil without any relief.  She has also taken Zofran which does help with the nausea.   Past Medical History:  Diagnosis Date   Allergy    Asthma    Depression    Frequent headaches    Pneumonia 06/19/2002   Vision abnormalities     Patient Active Problem List   Diagnosis Date Noted   GAD (generalized anxiety disorder) 12/18/2017   Deliberate self-cutting 12/18/2017   Insomnia 12/15/2015   MDD (major depressive disorder), recurrent episode, severe (HCC) 12/13/2015   Hearing problem of both ears 09/03/2013   Exercise-induced asthma 09/03/2013   Hyperacusis of both ears 09/03/2013    Past Surgical History:  Procedure Laterality Date   DILATION AND CURETTAGE OF UTERUS N/A 12/13/2018   Procedure: SUCTION DILATATION AND CURETTAGE;  Surgeon: Tilda BurrowFerguson, John V, MD;  Location: [facility];  Service: Gynecology;  Laterality: N/A;    OB History     Gravida  1   Para      Term      Preterm      AB  1    Living         SAB      IAB      Ectopic      Multiple      Live Births               Home Medications    Prior to Admission medications   Medication Sig Start Date End Date Taking? Authorizing Provider  ALBUTEROL SULFATE IN Inhale into the lungs.   Yes [provider]  promethazine-dextromethorphan (PROMETHAZINE-DM) 6.25-15 MG/5ML syrup Take 5 mLs by mouth 4 (four) times daily as needed for cough. Do not take while driving or operating heavy machinery 11/16/21  Yes Valentino NoseMartinez, Mamta Rimmer A, NP  acetaminophen (TYLENOL) 500 MG tablet Take 500 mg by mouth every 6 (six) hours as needed for mild pain or moderate pain.    [provider]  benzonatate (TESSALON) 100 MG capsule Take 1 capsule (100 mg total) by mouth every 8 (eight) hours. Do not take while driving or operating heavy machinery 11/16/21   Valentino NoseMartinez, Jennamarie Goings A, NP  cetirizine (ZYRTEC ALLERGY) 10 MG tablet Take 1 tablet (10 mg total) by mouth daily. 09/13/19   Avegno, Zachery DakinsKomlanvi S, FNP  cetirizine-pseudoephedrine (ZYRTEC-D) 5-120 MG tablet Take 1 tablet by mouth daily. 04/24/19   Wurst, GrenadaBrittany, PA-C  fluticasone (FLONASE) 50 MCG/ACT nasal spray Place  1 spray into both nostrils daily for 14 days. 09/13/19 09/27/19  Avegno, Zachery Dakins, FNP  ipratropium-albuterol (DUONEB) 0.5-2.5 (3) MG/3ML SOLN Take 3 mLs by nebulization every 6 (six) hours as needed. 09/05/20   Bing Neighbors, FNP  ondansetron (ZOFRAN ODT) 4 MG disintegrating tablet Take 1 tablet (4 mg total) by mouth every 8 (eight) hours as needed for nausea or vomiting. 11/16/21   Valentino Nose, NP  citalopram (CELEXA) 10 MG tablet Take 1 tablet (10 mg total) by mouth daily. Patient not taking: Reported on 01/29/2019 01/20/19 04/24/19  Salley Scarlet, MD    Family History Family History  Problem Relation Age of Onset   Miscarriages / [place] Mother    Arthritis Maternal Grandmother    Heart disease Maternal Grandfather    Hypertension Maternal  Grandfather    Cancer Paternal Grandmother    Cancer Paternal Grandfather     Social History Social History   Tobacco Use   Smoking status: Never    Passive exposure: Yes   Smokeless tobacco: Never  Vaping Use   Vaping Use: Every day  Substance Use Topics   Alcohol use: No   Drug use: Not Currently    Types: Marijuana     Allergies   Patient has no known allergies.   Review of Systems Review of Systems Per HPI  Physical Exam Triage Vital Signs ED Triage Vitals  Enc Vitals Group     BP 11/16/21 0934 119/78     Pulse Rate 11/16/21 0934 62     Resp 11/16/21 0934 16     Temp 11/16/21 0934 98.3 F (36.8 C)     Temp Source 11/16/21 0934 Oral     SpO2 11/16/21 0934 98 %     Weight --      Height --      Head Circumference --      Peak Flow --      Pain Score 11/16/21 0931 4     Pain Loc --      Pain Edu? --      Excl. in GC? --    No data found.  Updated Vital Signs BP 119/78 (BP Location: Right Arm)   Pulse 62   Temp 98.3 F (36.8 C) (Oral)   Resp 16   SpO2 98%   Visual Acuity Right Eye Distance:   Left Eye Distance:   Bilateral Distance:    Right Eye Near:   Left Eye Near:    Bilateral Near:     Physical Exam Vitals and nursing note reviewed.  Constitutional:      General: She is not in acute distress.    Appearance: Normal appearance. She is not ill-appearing or toxic-appearing.  HENT:     Head: Normocephalic and atraumatic.     Right Ear: Tympanic membrane, ear canal and external ear normal.     Left Ear: Tympanic membrane, ear canal and external ear normal.     Nose: Rhinorrhea present. No congestion.     Right Sinus: No maxillary sinus tenderness or frontal sinus tenderness.     Left Sinus: No maxillary sinus tenderness or frontal sinus tenderness.     Mouth/Throat:     Mouth: Mucous membranes are moist.     Pharynx: Oropharynx is clear. Posterior oropharyngeal erythema present. No oropharyngeal exudate.     Tonsils: 1+ on the right.  1+ on the left.  Eyes:     General: No scleral icterus.    Extraocular Movements:  Extraocular movements intact.  Cardiovascular:     Rate and Rhythm: Normal rate and regular rhythm.  Pulmonary:     Effort: Pulmonary effort is normal. No respiratory distress.     Breath sounds: Normal breath sounds. No wheezing, rhonchi or rales.  Musculoskeletal:     Cervical back: Normal range of motion and neck supple.  Lymphadenopathy:     Cervical: Cervical adenopathy present.  Skin:    General: Skin is warm and dry.     Capillary Refill: Capillary refill takes less than 2 seconds.     Coloration: Skin is not jaundiced or pale.     Findings: No erythema or rash.  Neurological:     Mental Status: She is alert and oriented to person, place, and time.  Psychiatric:        Mood and Affect: Mood normal.        Behavior: Behavior is cooperative.     UC Treatments / Results  Labs (all labs ordered are listed, but only abnormal results are displayed) Labs Reviewed  COVID-19, FLU A+B NAA    EKG   Radiology No results found.  Procedures Procedures (including critical care time)  Medications Ordered in UC Medications - No data to display  Initial Impression / Assessment and Plan / UC Course  I have reviewed the triage vital signs and the nursing notes.  Pertinent labs & imaging results that were available during my care of the patient were reviewed by me and considered in my medical decision making (see chart for details).    COVID-19 and influenza testing obtained.  No wheezing on examination today.  Reassured patient that symptoms and exam findings are most consistent with a viral upper respiratory infection and explained lack of efficacy of antibiotics against viruses.  Discussed expected course and features suggestive of secondary bacterial infection.  Continue supportive care. Increase fluid intake with water or electrolyte solution like pedialyte. Encouraged acetaminophen as needed  for fever/pain. Encouraged salt water gargling, chloraseptic spray and throat lozenges. Encouraged OTC guaifenesin. Encouraged saline sinus flushes and/or neti with humidified air.  Start alternating Tessalon perles and promethazine-dextromethorphan for cough and can use Zofran for nausea; refill given.  Seek care if symptoms persist without improvement for more than 7-10 days.  If symptoms worsen, go to ER.  Note given for work.  Final Clinical Impressions(s) / UC Diagnoses   Final diagnoses:  Viral URI with cough     Discharge Instructions      Your symptoms and exam findings are most consistent with a viral upper respiratory infection. These usually run their course in about 10 days.  If your symptoms last longer than 10 days without improvement, please follow up with your primary care provider.  If your symptoms, worsen, please go to the Emergency Room.    We have tested you today for COVID-19 and influenza.  You will see the results in Mychart and we will call you with positive results.    Please stay home and isolate until you are aware of the results.    Some things that can make you feel better are: - Increased rest - Increasing fluid with water/sugar free electrolytes - Acetaminophen and ibuprofen as needed for fever/pain.  - Salt water gargling, chloraseptic spray and throat lozenges - OTC guaifenesin (Mucinex).  - Saline sinus flushes or a neti pot.  - Humidifying the air.  -You can use Zofran 4 mg every 8 hours as needed under your tongue to help with nausea/vomiting.  You can also use the Tessalon Perles every 8 hours as needed alternating with the promethazine-dextromethorphan.  Do not take these at the same time, and please do not drive or operate heavy machinery while taking them.     ED Prescriptions     Medication Sig Dispense Auth. Provider   benzonatate (TESSALON) 100 MG capsule Take 1 capsule (100 mg total) by mouth every 8 (eight) hours. Do not take while  driving or operating heavy machinery 21 capsule Cathlean Marseilles A, NP   ondansetron (ZOFRAN ODT) 4 MG disintegrating tablet Take 1 tablet (4 mg total) by mouth every 8 (eight) hours as needed for nausea or vomiting. 20 tablet Cathlean Marseilles A, NP   promethazine-dextromethorphan (PROMETHAZINE-DM) 6.25-15 MG/5ML syrup Take 5 mLs by mouth 4 (four) times daily as needed for cough. Do not take while driving or operating heavy machinery 118 mL Valentino Nose, NP      PDMP not reviewed this encounter.   Valentino Nose, NP 11/16/21 1034

## 2021-11-16 NOTE — ED Triage Notes (Signed)
Pt reports cough, congestion, nausea, lightheaded, and left ear pain x 4 days. States her vision goes "black for a second".  DayQuil, NyQuil and albuterol inhaler gives some relied.

## 2021-11-18 LAB — COVID-19, FLU A+B NAA
Influenza A, NAA: NOT DETECTED
Influenza B, NAA: NOT DETECTED
SARS-CoV-2, NAA: NOT DETECTED

## 2022-04-11 ENCOUNTER — Ambulatory Visit (INDEPENDENT_AMBULATORY_CARE_PROVIDER_SITE_OTHER): Payer: BC Managed Care – PPO | Admitting: Psychiatry

## 2022-04-11 ENCOUNTER — Encounter (HOSPITAL_COMMUNITY): Payer: Self-pay | Admitting: Psychiatry

## 2022-04-11 DIAGNOSIS — F5089 Other specified eating disorder: Secondary | ICD-10-CM

## 2022-04-11 DIAGNOSIS — F17290 Nicotine dependence, other tobacco product, uncomplicated: Secondary | ICD-10-CM

## 2022-04-11 DIAGNOSIS — F1221 Cannabis dependence, in remission: Secondary | ICD-10-CM | POA: Insufficient documentation

## 2022-04-11 DIAGNOSIS — F1290 Cannabis use, unspecified, uncomplicated: Secondary | ICD-10-CM | POA: Diagnosis not present

## 2022-04-11 DIAGNOSIS — F4310 Post-traumatic stress disorder, unspecified: Secondary | ICD-10-CM | POA: Diagnosis not present

## 2022-04-11 DIAGNOSIS — Z9152 Personal history of nonsuicidal self-harm: Secondary | ICD-10-CM

## 2022-04-11 DIAGNOSIS — F411 Generalized anxiety disorder: Secondary | ICD-10-CM | POA: Diagnosis not present

## 2022-04-11 DIAGNOSIS — F410 Panic disorder [episodic paroxysmal anxiety] without agoraphobia: Secondary | ICD-10-CM

## 2022-04-11 DIAGNOSIS — F333 Major depressive disorder, recurrent, severe with psychotic symptoms: Secondary | ICD-10-CM

## 2022-04-11 DIAGNOSIS — F502 Bulimia nervosa: Secondary | ICD-10-CM | POA: Insufficient documentation

## 2022-04-11 DIAGNOSIS — G4709 Other insomnia: Secondary | ICD-10-CM

## 2022-04-11 MED ORDER — MIRTAZAPINE 15 MG PO TABS: 15.0000 mg | ORAL_TABLET | Freq: Every day | ORAL | 0 refills | Status: DC

## 2022-04-11 NOTE — Patient Instructions (Signed)
We added remeron (mirtazapine) 15mg  to your medication regimen today, take this at night when ready to go to bed. Your PCP's office will coordinate getting some blood work and checking your vital signs soon. Dr. Nehemiah Settle has placed a referral for psychotherapy at our office.
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
## 2022-04-25 ENCOUNTER — Telehealth (INDEPENDENT_AMBULATORY_CARE_PROVIDER_SITE_OTHER): Payer: BC Managed Care – PPO | Admitting: Psychiatry

## 2022-04-25 ENCOUNTER — Encounter (HOSPITAL_COMMUNITY): Payer: Self-pay | Admitting: Psychiatry

## 2022-04-25 DIAGNOSIS — F411 Generalized anxiety disorder: Secondary | ICD-10-CM | POA: Diagnosis not present

## 2022-04-25 DIAGNOSIS — F4310 Post-traumatic stress disorder, unspecified: Secondary | ICD-10-CM

## 2022-04-25 DIAGNOSIS — G4709 Other insomnia: Secondary | ICD-10-CM

## 2022-04-25 DIAGNOSIS — F17290 Nicotine dependence, other tobacco product, uncomplicated: Secondary | ICD-10-CM

## 2022-04-25 DIAGNOSIS — F502 Bulimia nervosa: Secondary | ICD-10-CM | POA: Diagnosis not present

## 2022-04-25 DIAGNOSIS — S060X9A Concussion with loss of consciousness of unspecified duration, initial encounter: Secondary | ICD-10-CM

## 2022-04-25 DIAGNOSIS — F1290 Cannabis use, unspecified, uncomplicated: Secondary | ICD-10-CM

## 2022-04-25 DIAGNOSIS — F333 Major depressive disorder, recurrent, severe with psychotic symptoms: Secondary | ICD-10-CM

## 2022-04-25 DIAGNOSIS — F410 Panic disorder [episodic paroxysmal anxiety] without agoraphobia: Secondary | ICD-10-CM

## 2022-04-25 MED ORDER — FLUOXETINE HCL 10 MG PO CAPS: 10.0000 mg | ORAL_CAPSULE | Freq: Every day | ORAL | 1 refills | Status: DC

## 2022-04-25 MED ORDER — MIRTAZAPINE 15 MG PO TABS: 15.0000 mg | ORAL_TABLET | Freq: Every day | ORAL | 0 refills | Status: DC

## 2022-04-25 MED ORDER — NICOTINE 21 MG/24HR TD PT24: 21.0000 mg | MEDICATED_PATCH | Freq: Every day | TRANSDERMAL | 0 refills | Status: DC

## 2022-04-25 NOTE — Patient Instructions (Addendum)
We added nicotine 49mcg patches daily to your regimen, be sure to remove them before sleep. We otherwise kept your medications the same today with your recent syncope (blacking out). With the concussion you sustained from the fall, agree with your PCP to be evaluated in the ER to get head imaging. I am still awaiting on the results of your blood work but we may need to consider stabilizing you in the hospital given your fall risk at present. I will be able to discuss with your PCP later today.  This is a great website with concussion recovery resources: [email or web address]-reyes.[email or web address]  Here is the quit line in case medicaid doesn't cover the nicotine patches: ShowReturn.ca
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
## 2022-05-15 ENCOUNTER — Encounter (HOSPITAL_COMMUNITY): Payer: Self-pay | Admitting: Psychiatry

## 2022-05-15 ENCOUNTER — Ambulatory Visit (INDEPENDENT_AMBULATORY_CARE_PROVIDER_SITE_OTHER): Payer: BC Managed Care – PPO | Admitting: Psychiatry

## 2022-05-15 DIAGNOSIS — F411 Generalized anxiety disorder: Secondary | ICD-10-CM | POA: Diagnosis not present

## 2022-05-15 DIAGNOSIS — F410 Panic disorder [episodic paroxysmal anxiety] without agoraphobia: Secondary | ICD-10-CM

## 2022-05-15 DIAGNOSIS — F4310 Post-traumatic stress disorder, unspecified: Secondary | ICD-10-CM | POA: Diagnosis not present
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
## 2022-05-17 NOTE — Therapy (Deleted)
OUTPATIENT PHYSICAL THERAPY FEMALE PELVIC EVALUATION   Patient Name: Amy Fuller MRN: [id number] DOB:29-Nov-2000, 21 y.o., female Today's Date: 05/17/2022  END OF SESSION:   Past Medical History:  Diagnosis Date   Allergy    Anxiety    Asthma    Depression    Frequent headaches    Pneumonia 06/19/2002   Vision abnormalities    Past Surgical History:  Procedure Laterality Date   DILATION AND CURETTAGE OF UTERUS N/A 12/13/2018   Procedure: SUCTION DILATATION AND CURETTAGE;  Surgeon: Tilda Burrow, MD;  Location: [facility];  Service: Gynecology;  Laterality: N/A;   Patient Active Problem List   Diagnosis Date Noted   Concussion with loss of consciousness 04/25/2022   Bulimia nervosa 04/11/2022   PTSD (post-traumatic stress disorder) 04/11/2022   Vaping nicotine dependence, tobacco product 04/11/2022   Cannabis use disorder 04/11/2022   Generalized anxiety disorder with panic attacks 12/18/2017   History of non-suicidal self-harm: cutting 12/18/2017   Insomnia 12/15/2015   MDD (major depressive disorder), recurrent episode, severe (HCC) 12/13/2015   Hearing problem of both ears 09/03/2013   Exercise-induced asthma 09/03/2013   Hyperacusis of both ears 09/03/2013    PCP: none  REFERRING PROVIDER: Lovey Newcomer, PA  REFERRING DIAG:  Diagnosis Description  PT eval/tx for overactive bladder and urinary leakage     THERAPY DIAG:  Stress and urge incontinence   Rationale for Evaluation and Treatment: Rehabilitation  ONSET DATE: ***  SUBJECTIVE:                                                                                                                                                                                           SUBJECTIVE STATEMENT: *** Fluid intake: {Yes/No:304960894}   PAIN:  Are you having pain? {yes/no:20286} NPRS scale: ***/10 Pain location: {pelvic pain location:27098}  Pain type: {type:313116} Pain description: {PAIN  DESCRIPTION:21022940}   Aggravating factors: *** Relieving factors: ***  PRECAUTIONS: None  WEIGHT BEARING RESTRICTIONS: No  FALLS:  Has patient fallen in last 6 months? No  LIVING ENVIRONMENT: Lives with: lives with their family Lives in: House/apartment OCCUPATION: ***  PLOF: Independent  PATIENT GOALS: less leakage   PERTINENT HISTORY:  *** BOWEL MOVEMENT: Pain with bowel movement: {yes/no:20286} Type of bowel movement:{PT BM type:27100} Fully empty rectum: {Yes/No:304960894} Leakage: {Yes/No:304960894} Pads: {Yes/No:304960894} Fiber supplement: {Yes/No:304960894}  URINATION: Pain with urination: {yes/no:20286} Fully empty bladder: {Yes/No:304960894} Stream: {PT urination:27102} Urgency: {Yes/No:304960894} Frequency: *** Leakage: {PT leakage:27103} Pads: {Yes/No:304960894}  INTERCOURSE: Pain with intercourse: {pain with intercourse PA:27099} Ability to have vaginal penetration:  {Yes/No:304960894} PREGNANCY: Vaginal deliveries *** Tearing {Yes***/No:304960894} C-section deliveries *** Currently pregnant {  Yes***/No:304960894}  PROLAPSE: None   OBJECTIVE:   DIAGNOSTIC FINDINGS:  ***  COGNITION: Overall cognitive status: Within functional limits for tasks assessed      POSTURE: {posture:25561}   TODAY'S TREATMENT:                                                                                                                              DATE: ***  EVAL ***   PATIENT EDUCATION:  Education details: *** Person educated: {Person educated:25204} Education method: {Education Method:25205} Education comprehension: {Education Comprehension:25206}  HOME EXERCISE PROGRAM: ***  ASSESSMENT:  CLINICAL IMPRESSION: Patient is a 21 y.o. female  who was seen today for physical therapy evaluation and treatment for urinary incontinence.  Ms. Bowland has noted postural dysfunction, decreased core and pelvic floor strength which is causing both stress and  urge incontinence.  Ms. Failing will benefit from skilled PT to address her deficits and improve her incontinence.  OBJECTIVE IMPAIRMENTS: decreased strength and postural dysfunction.   ACTIVITY LIMITATIONS:  exercise     REHAB POTENTIAL: Good  CLINICAL DECISION MAKING: Stable/uncomplicated  EVALUATION COMPLEXITY: Moderate   GOALS: Goals reviewed with patient? No  SHORT TERM GOALS: Target date: ***  Pt will be I in HEP in order to decrease her pad use to  Baseline: Goal status: {GOALSTATUS:25110}  2.  PT to be more aware of her posture  Baseline:  Goal status: {GOALSTATUS:25110}    LONG TERM GOALS: Target date: ***  Pt will be I in advanced HEP in order to decrease her pad use to  Baseline:  Goal status: {GOALSTATUS:25110}      PLAN:  PT FREQUENCY: 1x/week  PT DURATION: 4 weeks  PLANNED INTERVENTIONS: Therapeutic exercises, Therapeutic activity, Patient/Family education, and Self Care  PLAN FOR NEXT SESSION: *** Rayetta Humphrey, PT CLT [phone]

## 2022-05-18 ENCOUNTER — Ambulatory Visit (HOSPITAL_COMMUNITY): Payer: BC Managed Care – PPO | Attending: Physical Therapy | Admitting: Physical Therapy

## 2022-05-24 ENCOUNTER — Telehealth (INDEPENDENT_AMBULATORY_CARE_PROVIDER_SITE_OTHER): Payer: BC Managed Care – PPO | Admitting: Psychiatry

## 2022-05-24 ENCOUNTER — Encounter (HOSPITAL_COMMUNITY): Payer: Self-pay | Admitting: Psychiatry

## 2022-05-24 DIAGNOSIS — G4709 Other insomnia: Secondary | ICD-10-CM

## 2022-05-24 DIAGNOSIS — F17290 Nicotine dependence, other tobacco product, uncomplicated: Secondary | ICD-10-CM

## 2022-05-24 DIAGNOSIS — F411 Generalized anxiety disorder: Secondary | ICD-10-CM | POA: Diagnosis not present

## 2022-05-24 DIAGNOSIS — F4310 Post-traumatic stress disorder, unspecified: Secondary | ICD-10-CM

## 2022-05-24 DIAGNOSIS — F1290 Cannabis use, unspecified, uncomplicated: Secondary | ICD-10-CM | POA: Diagnosis not present

## 2022-05-24 DIAGNOSIS — F333 Major depressive disorder, recurrent, severe with psychotic symptoms: Secondary | ICD-10-CM

## 2022-05-24 DIAGNOSIS — F502 Bulimia nervosa: Secondary | ICD-10-CM

## 2022-05-24 DIAGNOSIS — F410 Panic disorder [episodic paroxysmal anxiety] without agoraphobia: Secondary | ICD-10-CM

## 2022-05-24 MED ORDER — MIRTAZAPINE 30 MG PO TABS: 30.0000 mg | ORAL_TABLET | Freq: Every day | ORAL | 1 refills | Status: DC
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
## 2022-05-24 NOTE — Patient Instructions (Addendum)
Please contact your PCP to get the following blood work drawn and/or send the results over: CBC, CMP, lipase (they have already drawn these 3), phosphorus, magnesium, iron panel with ferritin, vitamin b12, vitamin d, folate, lipid panel, ecg. Ask them to put in for a nutrition referral as well. Otherwise, we increase your remeron to 30mg  nightly. Try to cut back on the amount you are vaping and using marijuana. This will make you less anxious, nauseous and hallucinate less.

## 2022-06-11 ENCOUNTER — Other Ambulatory Visit (HOSPITAL_COMMUNITY): Payer: Self-pay | Admitting: Psychiatry

## 2022-06-11 DIAGNOSIS — F333 Major depressive disorder, recurrent, severe with psychotic symptoms: Secondary | ICD-10-CM

## 2022-06-11 DIAGNOSIS — F502 Bulimia nervosa: Secondary | ICD-10-CM

## 2022-06-11 DIAGNOSIS — F4310 Post-traumatic stress disorder, unspecified: Secondary | ICD-10-CM

## 2022-06-11 DIAGNOSIS — G4709 Other insomnia: Secondary | ICD-10-CM

## 2022-06-26 ENCOUNTER — Encounter (HOSPITAL_COMMUNITY): Payer: Self-pay | Admitting: Psychiatry

## 2022-06-26 ENCOUNTER — Telehealth (INDEPENDENT_AMBULATORY_CARE_PROVIDER_SITE_OTHER): Payer: BC Managed Care – PPO | Admitting: Psychiatry

## 2022-06-26 DIAGNOSIS — R55 Syncope and collapse: Secondary | ICD-10-CM

## 2022-06-26 DIAGNOSIS — G4709 Other insomnia: Secondary | ICD-10-CM

## 2022-06-26 DIAGNOSIS — F502 Bulimia nervosa: Secondary | ICD-10-CM

## 2022-06-26 DIAGNOSIS — F410 Panic disorder [episodic paroxysmal anxiety] without agoraphobia: Secondary | ICD-10-CM

## 2022-06-26 DIAGNOSIS — F17290 Nicotine dependence, other tobacco product, uncomplicated: Secondary | ICD-10-CM

## 2022-06-26 DIAGNOSIS — F4310 Post-traumatic stress disorder, unspecified: Secondary | ICD-10-CM

## 2022-06-26 DIAGNOSIS — F1290 Cannabis use, unspecified, uncomplicated: Secondary | ICD-10-CM | POA: Diagnosis not present

## 2022-06-26 DIAGNOSIS — F333 Major depressive disorder, recurrent, severe with psychotic symptoms: Secondary | ICD-10-CM

## 2022-06-26 DIAGNOSIS — F411 Generalized anxiety disorder: Secondary | ICD-10-CM

## 2022-06-26 HISTORY — DX: Syncope and collapse: R55

## 2022-06-26 MED ORDER — MIRTAZAPINE 30 MG PO TABS: 30.0000 mg | ORAL_TABLET | Freq: Every day | ORAL | 1 refills | Status: DC

## 2022-06-26 NOTE — Patient Instructions (Signed)
Please call yourPCP's office to either send or obtain results of CBC, CMP, lipase (they have already drawn these 3), phosphorus, magnesium, iron panel with ferritin, vitamin b12, vitamin d, folate, lipid panel, ecg.  We also need you to get set up with a nutritionist which her PCP should be able to put in a referral for.  Please start using the nicotine patches to help you have a glass which should help with your anxiety and nausea.  Similarly if you can cut back on the amount of marijuana you are using this should help with the nausea and vomiting.  Do consider going into the hospital at least for short stay in order to get medically stabilized given that you keep having episodes of passing out.
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
## 2022-06-28 ENCOUNTER — Ambulatory Visit (HOSPITAL_COMMUNITY): Payer: BC Managed Care – PPO | Admitting: Psychiatry

## 2022-07-03 ENCOUNTER — Encounter (HOSPITAL_COMMUNITY): Payer: Self-pay

## 2022-07-12 ENCOUNTER — Telehealth (HOSPITAL_COMMUNITY): Payer: Self-pay | Admitting: Psychiatry

## 2022-07-12 ENCOUNTER — Ambulatory Visit (HOSPITAL_COMMUNITY): Payer: BC Managed Care – PPO | Admitting: Psychiatry

## 2022-07-12 NOTE — Telephone Encounter (Signed)
Therapist attempted to contact patient via phone regarding scheduled in person visit and received voicemail message.  Therapist left message indicating attempt and requesting patient call office.

## 2022-07-26 ENCOUNTER — Encounter (HOSPITAL_COMMUNITY): Payer: Self-pay | Admitting: Psychiatry

## 2022-07-26 ENCOUNTER — Telehealth (HOSPITAL_COMMUNITY): Payer: Self-pay | Admitting: Psychiatry

## 2022-07-26 ENCOUNTER — Ambulatory Visit (HOSPITAL_COMMUNITY): Payer: BC Managed Care – PPO | Admitting: Psychiatry

## 2022-07-26 NOTE — Telephone Encounter (Signed)
Therapist attempted to contact patient via phone regarding in office scheduled appointment and received voicemail message.  Therapist called patient, left message indicating attempt, and requesting patient call office.

## 2022-08-09 ENCOUNTER — Ambulatory Visit (HOSPITAL_COMMUNITY): Payer: BC Managed Care – PPO | Admitting: Psychiatry

## 2022-08-30 ENCOUNTER — Encounter (HOSPITAL_COMMUNITY): Payer: Self-pay | Admitting: Psychiatry

## 2022-08-30 ENCOUNTER — Telehealth (INDEPENDENT_AMBULATORY_CARE_PROVIDER_SITE_OTHER): Payer: BC Managed Care – PPO | Admitting: Psychiatry

## 2022-08-30 DIAGNOSIS — F4310 Post-traumatic stress disorder, unspecified: Secondary | ICD-10-CM | POA: Diagnosis not present

## 2022-08-30 DIAGNOSIS — F502 Bulimia nervosa: Secondary | ICD-10-CM | POA: Diagnosis not present

## 2022-08-30 DIAGNOSIS — R55 Syncope and collapse: Secondary | ICD-10-CM

## 2022-08-30 DIAGNOSIS — F333 Major depressive disorder, recurrent, severe with psychotic symptoms: Secondary | ICD-10-CM | POA: Diagnosis not present

## 2022-08-30 DIAGNOSIS — G4709 Other insomnia: Secondary | ICD-10-CM

## 2022-08-30 DIAGNOSIS — F1290 Cannabis use, unspecified, uncomplicated: Secondary | ICD-10-CM | POA: Diagnosis not present

## 2022-08-30 DIAGNOSIS — F411 Generalized anxiety disorder: Secondary | ICD-10-CM

## 2022-08-30 DIAGNOSIS — F17290 Nicotine dependence, other tobacco product, uncomplicated: Secondary | ICD-10-CM

## 2022-08-30 DIAGNOSIS — F410 Panic disorder [episodic paroxysmal anxiety] without agoraphobia: Secondary | ICD-10-CM

## 2022-08-30 MED ORDER — MIRTAZAPINE 30 MG PO TABS: 30.0000 mg | ORAL_TABLET | Freq: Every day | ORAL | 1 refills | Status: DC

## 2022-08-30 MED ORDER — FLUOXETINE HCL 10 MG PO CAPS: 10.0000 mg | ORAL_CAPSULE | Freq: Every day | ORAL | 0 refills | Status: DC

## 2022-08-30 NOTE — Patient Instructions (Addendum)
We did not make any medication changes today.  Keep doing your best to practice the sensory technique we did in session today and this should make going into public spaces easier.  Please reach out to the clinic but shares a building with [place] to get established with primary care ([email or web address]) that way we can get the blood tests we need for risk stratification for your eating disorder.  With the syncopal episodes hospitalization may still be necessary for stabilization purposes.  Keep up the good work with cutting back on the amount of marijuana you are consuming and he may consider calling the quit Line for help with nicotine replacement.
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
## 2022-09-15 ENCOUNTER — Encounter (HOSPITAL_COMMUNITY): Payer: Self-pay

## 2022-09-19 ENCOUNTER — Encounter (HOSPITAL_COMMUNITY): Payer: Self-pay | Admitting: Psychiatry

## 2022-09-19 ENCOUNTER — Telehealth (INDEPENDENT_AMBULATORY_CARE_PROVIDER_SITE_OTHER): Payer: BC Managed Care – PPO | Admitting: Psychiatry

## 2022-09-19 DIAGNOSIS — G4709 Other insomnia: Secondary | ICD-10-CM | POA: Diagnosis not present

## 2022-09-19 DIAGNOSIS — F4310 Post-traumatic stress disorder, unspecified: Secondary | ICD-10-CM

## 2022-09-19 DIAGNOSIS — F17290 Nicotine dependence, other tobacco product, uncomplicated: Secondary | ICD-10-CM

## 2022-09-19 DIAGNOSIS — F333 Major depressive disorder, recurrent, severe with psychotic symptoms: Secondary | ICD-10-CM

## 2022-09-19 DIAGNOSIS — F502 Bulimia nervosa: Secondary | ICD-10-CM

## 2022-09-19 DIAGNOSIS — R112 Nausea with vomiting, unspecified: Secondary | ICD-10-CM | POA: Insufficient documentation

## 2022-09-19 DIAGNOSIS — F411 Generalized anxiety disorder: Secondary | ICD-10-CM | POA: Diagnosis not present

## 2022-09-19 DIAGNOSIS — F410 Panic disorder [episodic paroxysmal anxiety] without agoraphobia: Secondary | ICD-10-CM

## 2022-09-19 DIAGNOSIS — F5089 Other specified eating disorder: Secondary | ICD-10-CM

## 2022-09-19 DIAGNOSIS — F1290 Cannabis use, unspecified, uncomplicated: Secondary | ICD-10-CM

## 2022-09-19 MED ORDER — MIRTAZAPINE 45 MG PO TABS: 45.0000 mg | ORAL_TABLET | Freq: Every day | ORAL | 1 refills | Status: DC

## 2022-09-19 NOTE — Patient Instructions (Signed)
We increased the Remeron (mirtazapine) to 45 mg nightly.  We also practiced psychotherapy technique which I want you to continue to practice in between appointments and also reach out to [email or web address] to find a psychotherapist that is in network and has a specialization in eating disorders.  If he can come by the lab I have placed the orders for your blood work to just come by the front desk first and they should be able to give you the order forms to take upstairs.
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
## 2022-09-22 ENCOUNTER — Encounter (HOSPITAL_COMMUNITY): Payer: Self-pay

## 2022-10-04 ENCOUNTER — Telehealth (INDEPENDENT_AMBULATORY_CARE_PROVIDER_SITE_OTHER): Payer: BC Managed Care – PPO | Admitting: Psychiatry

## 2022-10-04 ENCOUNTER — Encounter (HOSPITAL_COMMUNITY): Payer: Self-pay | Admitting: Psychiatry

## 2022-10-04 DIAGNOSIS — F502 Bulimia nervosa, unspecified: Secondary | ICD-10-CM

## 2022-10-04 DIAGNOSIS — F410 Panic disorder [episodic paroxysmal anxiety] without agoraphobia: Secondary | ICD-10-CM

## 2022-10-04 DIAGNOSIS — F17290 Nicotine dependence, other tobacco product, uncomplicated: Secondary | ICD-10-CM | POA: Diagnosis not present

## 2022-10-04 DIAGNOSIS — F411 Generalized anxiety disorder: Secondary | ICD-10-CM

## 2022-10-04 DIAGNOSIS — F332 Major depressive disorder, recurrent severe without psychotic features: Secondary | ICD-10-CM

## 2022-10-04 DIAGNOSIS — F1290 Cannabis use, unspecified, uncomplicated: Secondary | ICD-10-CM

## 2022-10-04 MED ORDER — GABAPENTIN 100 MG PO CAPS
100.0000 mg | ORAL_CAPSULE | Freq: Two times a day (BID) | ORAL | 0 refills | Status: DC | PRN
Start: 2022-10-04 — End: 2022-11-27
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
## 2022-10-04 NOTE — Patient Instructions (Addendum)
We added gabapentin 100 mg twice daily as needed for panic attacks to your regimen today.  We should lower impulsivity and start to help to manage the excesses of amount of anxiety that you are having.  Please do go by the lab to get your blood work drawn when you can.  If you want to read more about the partial hospitalization program he can find more information here: [email or web address]

## 2022-10-17 ENCOUNTER — Other Ambulatory Visit (HOSPITAL_COMMUNITY): Payer: Self-pay | Admitting: Psychiatry

## 2022-10-17 DIAGNOSIS — F333 Major depressive disorder, recurrent, severe with psychotic symptoms: Secondary | ICD-10-CM

## 2022-10-17 DIAGNOSIS — F502 Bulimia nervosa: Secondary | ICD-10-CM

## 2022-10-17 DIAGNOSIS — F4310 Post-traumatic stress disorder, unspecified: Secondary | ICD-10-CM

## 2022-10-17 DIAGNOSIS — G4709 Other insomnia: Secondary | ICD-10-CM

## 2022-10-20 ENCOUNTER — Telehealth (HOSPITAL_COMMUNITY): Payer: No Typology Code available for payment source | Admitting: Psychiatry

## 2022-11-24 ENCOUNTER — Other Ambulatory Visit (HOSPITAL_COMMUNITY): Payer: Self-pay | Admitting: Psychiatry

## 2022-11-24 DIAGNOSIS — F4310 Post-traumatic stress disorder, unspecified: Secondary | ICD-10-CM

## 2022-11-24 DIAGNOSIS — F333 Major depressive disorder, recurrent, severe with psychotic symptoms: Secondary | ICD-10-CM

## 2022-11-24 DIAGNOSIS — F502 Bulimia nervosa: Secondary | ICD-10-CM

## 2022-11-24 MED ORDER — FLUOXETINE HCL 10 MG PO CAPS: 10.0000 mg | ORAL_CAPSULE | Freq: Every day | ORAL | 0 refills | Status: DC

## 2022-11-24 NOTE — Telephone Encounter (Signed)
From: Amy Fuller To: Office of Elsie Lincoln, MD Sent: 11/22/2022 2:00 PM EDT Subject: Medication Renewal Request  Refills have been requested for the following medications:   FLUoxetine (PROZAC) 10 MG capsule [[name] A [name]]  Patient Comment: I lost my bottle two days ago.   Preferred pharmacy: [id number] - [place], [place] - [address] Delivery method: Pickup   Medication renewals requested in this message routed separately:   ondansetron (ZOFRAN ODT) 4 MG disintegrating tablet [name]]

## 2022-11-25 ENCOUNTER — Other Ambulatory Visit (HOSPITAL_COMMUNITY): Payer: Self-pay | Admitting: Psychiatry

## 2022-11-25 DIAGNOSIS — F4310 Post-traumatic stress disorder, unspecified: Secondary | ICD-10-CM

## 2022-11-25 DIAGNOSIS — G4709 Other insomnia: Secondary | ICD-10-CM

## 2022-11-25 DIAGNOSIS — F502 Bulimia nervosa: Secondary | ICD-10-CM

## 2022-11-25 DIAGNOSIS — F333 Major depressive disorder, recurrent, severe with psychotic symptoms: Secondary | ICD-10-CM

## 2022-11-27 ENCOUNTER — Telehealth (INDEPENDENT_AMBULATORY_CARE_PROVIDER_SITE_OTHER): Payer: BC Managed Care – PPO | Admitting: Psychiatry

## 2022-11-27 ENCOUNTER — Encounter (HOSPITAL_COMMUNITY): Payer: Self-pay | Admitting: Psychiatry

## 2022-11-27 DIAGNOSIS — F4310 Post-traumatic stress disorder, unspecified: Secondary | ICD-10-CM | POA: Diagnosis not present

## 2022-11-27 DIAGNOSIS — G4709 Other insomnia: Secondary | ICD-10-CM

## 2022-11-27 DIAGNOSIS — F333 Major depressive disorder, recurrent, severe with psychotic symptoms: Secondary | ICD-10-CM

## 2022-11-27 DIAGNOSIS — F502 Bulimia nervosa: Secondary | ICD-10-CM | POA: Diagnosis not present

## 2022-11-27 DIAGNOSIS — F1290 Cannabis use, unspecified, uncomplicated: Secondary | ICD-10-CM

## 2022-11-27 DIAGNOSIS — F411 Generalized anxiety disorder: Secondary | ICD-10-CM | POA: Diagnosis not present

## 2022-11-27 DIAGNOSIS — F410 Panic disorder [episodic paroxysmal anxiety] without agoraphobia: Secondary | ICD-10-CM

## 2022-11-27 DIAGNOSIS — F17290 Nicotine dependence, other tobacco product, uncomplicated: Secondary | ICD-10-CM

## 2022-11-27 DIAGNOSIS — R55 Syncope and collapse: Secondary | ICD-10-CM

## 2022-11-27 MED ORDER — MIRTAZAPINE 45 MG PO TABS
45.0000 mg | ORAL_TABLET | Freq: Every day | ORAL | 0 refills | Status: DC
Start: 2022-11-27 — End: 2022-12-26

## 2022-11-27 NOTE — Patient Instructions (Addendum)
Amy Fuller, I am sorry that things have been so tough since your last appointment.  I think priority #1 is making sure that you are in a safe environment and getting in touch with your father to be able to go to his home sound like a good first step.  As a backup option here is a website for locations of different safe shelters in [place]: Https://www.domesticshelters.org/help/[place]  And here is another hotline that you can call 24 hours a day 7 days a week: TVComponents.no  We will plan on keeping her medication regimen the same for the time being to prioritize safe housing and plan on meeting again next week.  With your ongoing passing out episodes I do think we really need to get the blood work and strongly should consider hospitalization to make sure we are staying on top of the eating disorder.
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
## 2022-12-04 ENCOUNTER — Encounter (HOSPITAL_COMMUNITY): Payer: Self-pay

## 2022-12-04 ENCOUNTER — Telehealth (HOSPITAL_COMMUNITY): Payer: BC Managed Care – PPO | Admitting: Psychiatry

## 2022-12-26 ENCOUNTER — Encounter (HOSPITAL_COMMUNITY): Payer: Self-pay | Admitting: Psychiatry

## 2022-12-26 ENCOUNTER — Telehealth (INDEPENDENT_AMBULATORY_CARE_PROVIDER_SITE_OTHER): Payer: BC Managed Care – PPO | Admitting: Psychiatry

## 2022-12-26 ENCOUNTER — Telehealth (HOSPITAL_COMMUNITY): Payer: Self-pay | Admitting: Licensed Clinical Social Worker

## 2022-12-26 DIAGNOSIS — F332 Major depressive disorder, recurrent severe without psychotic features: Secondary | ICD-10-CM

## 2022-12-26 DIAGNOSIS — F411 Generalized anxiety disorder: Secondary | ICD-10-CM | POA: Diagnosis not present

## 2022-12-26 DIAGNOSIS — F5089 Other specified eating disorder: Secondary | ICD-10-CM

## 2022-12-26 DIAGNOSIS — G4709 Other insomnia: Secondary | ICD-10-CM

## 2022-12-26 DIAGNOSIS — F502 Bulimia nervosa: Secondary | ICD-10-CM

## 2022-12-26 DIAGNOSIS — F410 Panic disorder [episodic paroxysmal anxiety] without agoraphobia: Secondary | ICD-10-CM

## 2022-12-26 DIAGNOSIS — F4310 Post-traumatic stress disorder, unspecified: Secondary | ICD-10-CM

## 2022-12-26 DIAGNOSIS — F17290 Nicotine dependence, other tobacco product, uncomplicated: Secondary | ICD-10-CM

## 2022-12-26 DIAGNOSIS — R55 Syncope and collapse: Secondary | ICD-10-CM

## 2022-12-26 DIAGNOSIS — F1221 Cannabis dependence, in remission: Secondary | ICD-10-CM

## 2022-12-26 MED ORDER — QUETIAPINE FUMARATE 25 MG PO TABS: ORAL_TABLET | ORAL | 2 refills | Status: DC

## 2022-12-26 NOTE — Telephone Encounter (Signed)
See call intake 
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
## 2022-12-26 NOTE — Patient Instructions (Addendum)
We fully discontinued the Prozac and Remeron due to ineffectiveness and side effects respectively today.  Start taking Seroquel (quetiapine) 25 mg nightly for the next 5 to 7 days and if you are not having any side effects on this you can increase to 50 mg nightly if the 25 mg dose was not effective for sleep.  Please coordinate with your PCP to get an EKG when you get the chance and they should be able to refill your Zofran as well.  The blood work we have previously ordered for risk stratification is still in place at our clinic so please come by when you get a chance to get that drawn.  We referred you to the partial hospitalization program in the direct line that you can call back from the message they left is: [phone].  They should be able to provide more day-to-day support than I am able to in the outpatient thing.  Do continue to consider full hospitalization as an option.  Keep up the good work with staying off of the marijuana and I would recommend staying off of the benzodiazepines like Ativan as they can make you more impulsive.

## 2022-12-27 ENCOUNTER — Telehealth (HOSPITAL_COMMUNITY): Payer: Self-pay | Admitting: Professional

## 2022-12-27 ENCOUNTER — Other Ambulatory Visit (HOSPITAL_COMMUNITY): Payer: BC Managed Care – PPO

## 2022-12-28 ENCOUNTER — Encounter (HOSPITAL_COMMUNITY): Payer: Self-pay | Admitting: Psychiatry

## 2022-12-28 ENCOUNTER — Telehealth (INDEPENDENT_AMBULATORY_CARE_PROVIDER_SITE_OTHER): Payer: BC Managed Care – PPO | Admitting: Psychiatry

## 2022-12-28 DIAGNOSIS — F4310 Post-traumatic stress disorder, unspecified: Secondary | ICD-10-CM | POA: Diagnosis not present

## 2022-12-28 DIAGNOSIS — G4709 Other insomnia: Secondary | ICD-10-CM

## 2022-12-28 DIAGNOSIS — F410 Panic disorder [episodic paroxysmal anxiety] without agoraphobia: Secondary | ICD-10-CM

## 2022-12-28 DIAGNOSIS — F411 Generalized anxiety disorder: Secondary | ICD-10-CM | POA: Diagnosis not present

## 2022-12-28 DIAGNOSIS — F17290 Nicotine dependence, other tobacco product, uncomplicated: Secondary | ICD-10-CM

## 2022-12-28 DIAGNOSIS — F5089 Other specified eating disorder: Secondary | ICD-10-CM

## 2022-12-28 DIAGNOSIS — Z9152 Personal history of nonsuicidal self-harm: Secondary | ICD-10-CM

## 2022-12-28 DIAGNOSIS — F332 Major depressive disorder, recurrent severe without psychotic features: Secondary | ICD-10-CM | POA: Diagnosis not present

## 2022-12-28 DIAGNOSIS — F1290 Cannabis use, unspecified, uncomplicated: Secondary | ICD-10-CM

## 2022-12-28 DIAGNOSIS — F502 Bulimia nervosa: Secondary | ICD-10-CM
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
## 2022-12-28 NOTE — Patient Instructions (Addendum)
We ultimately reached an impasse for your care today and decided to part ways. As we discussed, there are other options for your mental healthcare available. If you are looking for the partial hospitalization/intensive outpatient level of care here are some of those options:   [email or web address]

## 2022-12-29 LAB — LIPID PANEL
Cholesterol: 169 mg/dL (ref <200)
HDL: 54 mg/dL (ref >=50)
LDL Cholesterol (Calc): 97 mg/dL (calc)
Non-HDL Cholesterol (Calc): 115 mg/dL (calc) (ref <130)
Total CHOL/HDL Ratio: 3.1 (calc) (ref <5.0)
Triglycerides: 86 mg/dL (ref <150)

## 2022-12-29 LAB — CBC
HCT: 45.2 % — ABNORMAL HIGH (ref 35.0–45.0)
Hemoglobin: 15.5 g/dL (ref 11.7–15.5)
MCH: 31.3 pg (ref 27.0–33.0)
MCHC: 34.3 g/dL (ref 32.0–36.0)
MCV: 91.3 fL (ref 80.0–100.0)
MPV: 10.7 fL (ref 7.5–12.5)
Platelets: 389 10*3/uL (ref 140–400)
RBC: 4.95 10*6/uL (ref 3.80–5.10)
RDW: 12.1 % (ref 11.0–15.0)
WBC: 10.7 10*3/uL (ref 3.8–10.8)

## 2022-12-29 LAB — IRON,TIBC AND FERRITIN PANEL
%SAT: 43 % (calc) (ref 16–45)
Ferritin: 79 ng/mL (ref 16–154)
Iron: 131 ug/dL (ref 40–190)
TIBC: 308 mcg/dL (calc) (ref 250–450)

## 2022-12-29 LAB — COMPREHENSIVE METABOLIC PANEL
AG Ratio: 1.7 (calc) (ref 1.0–2.5)
ALT: 7 U/L (ref 6–29)
AST: 14 U/L (ref 10–30)
Albumin: 4.5 g/dL (ref 3.6–5.1)
Alkaline phosphatase (APISO): 71 U/L (ref 31–125)
BUN: 8 mg/dL (ref 7–25)
CO2: 25 mmol/L (ref 20–32)
Calcium: 10 mg/dL (ref 8.6–10.2)
Chloride: 101 mmol/L (ref 98–110)
Creat: 0.71 mg/dL (ref 0.50–0.96)
Globulin: 2.7 g/dL (calc) (ref 1.9–3.7)
Glucose, Bld: 97 mg/dL (ref 65–139)
Potassium: 3.6 mmol/L (ref 3.5–5.3)
Sodium: 138 mmol/L (ref 135–146)
Total Bilirubin: 0.5 mg/dL (ref 0.2–1.2)
Total Protein: 7.2 g/dL (ref 6.1–8.1)

## 2022-12-29 LAB — PHOSPHORUS: Phosphorus: 2.6 mg/dL (ref 2.5–4.5)

## 2022-12-29 LAB — B12 AND FOLATE PANEL
Folate: 17.3 ng/mL
Vitamin B-12: 395 pg/mL (ref 200–1100)

## 2022-12-29 LAB — MAGNESIUM: Magnesium: 2 mg/dL (ref 1.5–2.5)

## 2022-12-29 LAB — AMYLASE: Amylase: 53 U/L (ref 21–101)

## 2022-12-29 LAB — VITAMIN D 25 HYDROXY (VIT D DEFICIENCY, FRACTURES): Vit D, 25-Hydroxy: 28 ng/mL — ABNORMAL LOW (ref 30–100)

## 2023-01-01 ENCOUNTER — Encounter (HOSPITAL_COMMUNITY): Payer: Self-pay

## 2023-01-01 ENCOUNTER — Telehealth (HOSPITAL_COMMUNITY): Payer: BC Managed Care – PPO | Admitting: Psychiatry

## 2023-01-15 ENCOUNTER — Telehealth (HOSPITAL_COMMUNITY): Payer: BC Managed Care – PPO | Admitting: Psychiatry

## 2023-01-18 ENCOUNTER — Other Ambulatory Visit (HOSPITAL_COMMUNITY): Payer: Self-pay | Admitting: Psychiatry

## 2023-01-18 DIAGNOSIS — F410 Panic disorder [episodic paroxysmal anxiety] without agoraphobia: Secondary | ICD-10-CM

## 2023-01-18 DIAGNOSIS — G4709 Other insomnia: Secondary | ICD-10-CM

## 2023-01-18 DIAGNOSIS — F4310 Post-traumatic stress disorder, unspecified: Secondary | ICD-10-CM

## 2023-01-18 DIAGNOSIS — F332 Major depressive disorder, recurrent severe without psychotic features: Secondary | ICD-10-CM

## 2023-01-18 DIAGNOSIS — F502 Bulimia nervosa: Secondary | ICD-10-CM

## 2023-01-18 DIAGNOSIS — F5089 Other specified eating disorder: Secondary | ICD-10-CM

## 2023-02-25 ENCOUNTER — Other Ambulatory Visit (HOSPITAL_COMMUNITY): Payer: Self-pay | Admitting: Psychiatry

## 2023-02-25 DIAGNOSIS — F502 Bulimia nervosa: Secondary | ICD-10-CM

## 2023-02-25 DIAGNOSIS — F4310 Post-traumatic stress disorder, unspecified: Secondary | ICD-10-CM

## 2023-02-25 DIAGNOSIS — G4709 Other insomnia: Secondary | ICD-10-CM

## 2023-02-25 DIAGNOSIS — F333 Major depressive disorder, recurrent, severe with psychotic symptoms: Secondary | ICD-10-CM

## 2023-06-03 ENCOUNTER — Other Ambulatory Visit (HOSPITAL_COMMUNITY): Payer: Self-pay | Admitting: Psychiatry

## 2023-06-03 DIAGNOSIS — F502 Bulimia nervosa, unspecified: Secondary | ICD-10-CM

## 2023-06-03 DIAGNOSIS — F4310 Post-traumatic stress disorder, unspecified: Secondary | ICD-10-CM

## 2023-06-03 DIAGNOSIS — F333 Major depressive disorder, recurrent, severe with psychotic symptoms: Secondary | ICD-10-CM

## 2023-07-27 ENCOUNTER — Other Ambulatory Visit (HOSPITAL_COMMUNITY): Payer: Self-pay | Admitting: Psychiatry

## 2023-07-27 DIAGNOSIS — F502 Bulimia nervosa, unspecified: Secondary | ICD-10-CM

## 2023-07-27 DIAGNOSIS — F4310 Post-traumatic stress disorder, unspecified: Secondary | ICD-10-CM

## 2023-07-27 DIAGNOSIS — F333 Major depressive disorder, recurrent, severe with psychotic symptoms: Secondary | ICD-10-CM

## 2024-04-21 ENCOUNTER — Encounter: Payer: Self-pay | Admitting: Radiology

## 2024-04-28 NOTE — Progress Notes (Incomplete)
 Chief Complaint: No chief complaint on file.   History of Present Illness:  Amy Fuller is a 23 y.o. female who is seen in consultation from Pcp, No for evaluation of ***.   Past Medical History:  Past Medical History:  Diagnosis Date   Allergy    Anxiety    Asthma    Depression    Frequent headaches    Pneumonia 06/19/2002   Syncope 06/26/2022   Vision abnormalities     Past Surgical History:  Past Surgical History:  Procedure Laterality Date   DILATION AND CURETTAGE OF UTERUS N/A 12/13/2018   Procedure: SUCTION DILATATION AND CURETTAGE;  Surgeon: Edsel Norleen GAILS, MD;  Location: [facility];  Service: Gynecology;  Laterality: N/A;    Allergies:  No Known Allergies  Family History:  Family History  Problem Relation Age of Onset   Bipolar disorder Mother    Depression Mother    Anxiety disorder Mother    Miscarriages / Stillbirths Mother    Depression Father    Anxiety disorder Father    Heart disease Maternal Grandfather    Hypertension Maternal Grandfather    Arthritis Maternal Grandmother    Cancer Paternal Grandfather    Cancer Paternal Grandmother     Social History:  Social History   Tobacco Use   Smoking status: Every Day    Types: E-cigarettes, Cigarettes    Passive exposure: Yes   Smokeless tobacco: Never   Tobacco comments:    Vapes all day, catridge lasts about 1 week. Previously smoked cigarettes  Vaping Use   Vaping status: Every Day  Substance Use Topics   Alcohol use: Not Currently    Comment: will have 3 shots 3 times per year for special occassions   Drug use: Yes    Types: Marijuana, Benzodiazepines    Comment: Able to stop mini shorty joints roughly June 2024    Review of symptoms:  Constitutional:  Negative for unexplained weight loss, night sweats, fever, chills ENT:  Negative for nose bleeds, sinus pain, painful swallowing CV:  Negative for chest pain, shortness of breath, exercise intolerance, palpitations, loss of  consciousness Resp:  Negative for cough, wheezing, shortness of breath GI:  Negative for nausea, vomiting, diarrhea, bloody stools GU:  Positives noted in HPI; otherwise negative for gross hematuria, dysuria, urinary incontinence Neuro:  Negative for seizures, poor balance, limb weakness, slurred speech Psych:  Negative for lack of energy, depression, anxiety Endocrine:  Negative for polydipsia, polyuria, symptoms of hypoglycemia (dizziness, hunger, sweating) Hematologic:  Negative for anemia, purpura, petechia, prolonged or excessive bleeding, use of anticoagulants  Allergic:  Negative for difficulty breathing or choking as a result of exposure to anything; no shellfish allergy; no allergic response (rash/itch) to materials, foods  Physical exam: There were no vitals taken for this visit. GENERAL APPEARANCE:  Well appearing, well developed, well nourished, NAD HEENT: Atraumatic, Normocephalic, oropharynx clear. NECK: Supple without lymphadenopathy or thyromegaly. LUNGS: Clear to auscultation bilaterally. HEART: Regular Rate and Rhythm without murmurs, gallops, or rubs. ABDOMEN: Soft, non-tender, No Masses. EXTREMITIES: Moves all extremities well.  Without clubbing, cyanosis, or edema. NEUROLOGIC:  Alert and oriented x 3, normal gait, CN II-XII grossly intact.  MENTAL STATUS:  Appropriate. BACK:  Non-tender to palpation.  No CVAT SKIN:  Warm, dry and intact.    Results: No results found for this or any previous visit (from the past 24 hours).  I have reviewed prior patient's records  I have reviewed referring/prior physicians records  I have reviewed urinalysis  I have reviewed prior urine cultures  I reviewed prior imaging studies  Assessment: No diagnosis found.   Plan: ***

## 2024-04-29 ENCOUNTER — Ambulatory Visit: Admitting: Urology

## 2024-04-29 DIAGNOSIS — R399 Unspecified symptoms and signs involving the genitourinary system: Secondary | ICD-10-CM

## 2024-06-03 ENCOUNTER — Ambulatory Visit (HOSPITAL_COMMUNITY)
Admission: EM | Admit: 2024-06-03 | Discharge: 2024-06-03 | Discharge status: Against Medical Advice | Source: Home / Self Care | Attending: Behavioral Health | Admitting: Behavioral Health

## 2024-06-03 DIAGNOSIS — F333 Major depressive disorder, recurrent, severe with psychotic symptoms: Secondary | ICD-10-CM

## 2024-06-03 DIAGNOSIS — F410 Panic disorder [episodic paroxysmal anxiety] without agoraphobia: Secondary | ICD-10-CM

## 2024-06-03 DIAGNOSIS — F411 Generalized anxiety disorder: Secondary | ICD-10-CM
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
## 2024-06-03 NOTE — Discharge Instructions (Signed)
 Please F/u with resources as recommended.
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
## 2024-08-15 ENCOUNTER — Ambulatory Visit: Admitting: Physician Assistant
